# Patient Record
Sex: Female | Born: 1995 | Race: Black or African American | Hispanic: No | Marital: Married | Smoking: Former smoker
Health system: Southern US, Community
[De-identification: ages and names within clinical notes are randomized; demographics above are authoritative.]

## PROBLEM LIST (undated history)

## (undated) DIAGNOSIS — Z789 Other specified health status: Secondary | ICD-10-CM

## (undated) HISTORY — PX: NO PAST SURGERIES: SHX2092

## (undated) SURGERY — Surgical Case
Anesthesia: *Unknown

---

## 2020-02-09 ENCOUNTER — Inpatient Hospital Stay (HOSPITAL_COMMUNITY)
Admission: AD | Admit: 2020-02-09 | Discharge: 2020-02-09 | Disposition: A | Discharge status: Home / Self Care | Payer: Self-pay | Attending: Obstetrics & Gynecology | Admitting: Obstetrics & Gynecology

## 2020-02-09 ENCOUNTER — Other Ambulatory Visit: Payer: Self-pay

## 2020-02-09 DIAGNOSIS — Z3A01 Less than 8 weeks gestation of pregnancy: Secondary | ICD-10-CM | POA: Insufficient documentation

## 2020-02-09 DIAGNOSIS — Z3490 Encounter for supervision of normal pregnancy, unspecified, unspecified trimester: Secondary | ICD-10-CM

## 2020-02-09 DIAGNOSIS — O26891 Other specified pregnancy related conditions, first trimester: Secondary | ICD-10-CM | POA: Insufficient documentation

## 2020-02-09 NOTE — Discharge Instructions (Signed)
[place] [facility]yn Providers    [facility]       Phone: [phone]  [facility]   Phone: [phone]  [facility]   Phone: [phone]  [facility] [place]  Phone: [phone]  [facility] [place]  Phone: [phone]  [facility]   Phone: [phone]  [facility]       Phone: [phone]  [facility]    Phone: [phone]   [facility]    Phone: [phone]  [facility]    Phone: [phone]  [facility]    Phone: [phone]  [facility]       Phone: [phone]   [facility]  Phone: [phone]  [facility]    Phone: [phone]  [facility]   Phone: [phone]  [facility]      Phone: [phone]  [facility]    Phone: [phone]   

## 2020-02-09 NOTE — MAU Provider Note (Signed)
Patient Brittany Leon is a 24 y.o. No obstetric history on file.  at Unknown here to "check on the baby". She denies any complaints today.   She says that she had an LMP in December and then a positive pregnancy at the beginning of February.    Assessment and Plan  1.  1. Pregnancy, unspecified gestational age    28. Message sent to [facility] to schedule OB appointment in two weeks, after her dating [place].   3. Outpatient [place] for dating ordered.    Charlesetta Garibaldi Mahmoud Blazejewski 02/09/2020, 4:41 PM

## 2020-02-09 NOTE — MAU Note (Signed)
Brittany Leon is a 23 y.o. here in MAU reporting: is here to check on the baby. No pain, bleeding, or discharge. Has + UPT in February.   LMP: 10/25/19  Pain score: 0/10  Vitals:   02/09/20 1629  BP: (!) 97/51  Pulse: 75  Resp: 16  Temp: 99 F (37.2 C)  SpO2: 100%     Lab orders placed from triage: none

## 2020-02-13 ENCOUNTER — Ambulatory Visit (HOSPITAL_COMMUNITY): Payer: Self-pay

## 2020-02-19 ENCOUNTER — Other Ambulatory Visit: Payer: Self-pay

## 2020-02-19 ENCOUNTER — Other Ambulatory Visit (HOSPITAL_COMMUNITY): Payer: Self-pay | Admitting: Student

## 2020-02-19 ENCOUNTER — Ambulatory Visit (HOSPITAL_COMMUNITY)
Admission: RE | Admit: 2020-02-19 | Discharge: 2020-02-19 | Disposition: A | Discharge status: Home / Self Care | Payer: Medicaid Other | Source: Ambulatory Visit | Attending: Student | Admitting: Student

## 2020-02-19 ENCOUNTER — Ambulatory Visit (INDEPENDENT_AMBULATORY_CARE_PROVIDER_SITE_OTHER): Payer: Self-pay

## 2020-02-19 ENCOUNTER — Encounter: Payer: Self-pay | Admitting: Family Medicine

## 2020-02-19 DIAGNOSIS — Z3492 Encounter for supervision of normal pregnancy, unspecified, second trimester: Secondary | ICD-10-CM

## 2020-02-19 DIAGNOSIS — Z3490 Encounter for supervision of normal pregnancy, unspecified, unspecified trimester: Secondary | ICD-10-CM | POA: Insufficient documentation

## 2020-02-19 NOTE — Progress Notes (Signed)
Patient seen and assessed by nursing staff during this encounter. I have reviewed the chart and agree with the documentation and plan. I have also made any necessary editorial changes.  Cherre Robins, CNM 02/19/2020 3:27 PM

## 2020-02-19 NOTE — Progress Notes (Signed)
Pt here today for OB US results for unsure of LMP.  Reports presents with viable pregnancy with EDD 08/29/20, 12w 4d today, and FHR 154 bpm.  Notified Gerrit Heck, CNM who recommends to start prenatal care and PNV.  Pt denies any pain or vaginal bleeding. Medications/allergies reviewed.  List of medicines safe to take in pregnancy given.  Front office to provide pt with proof of pregnancy letter to start prenatal care.    Addison Naegeli, RN 02/19/20

## 2020-04-04 LAB — OB RESULTS CONSOLE ANTIBODY SCREEN: Antibody Screen: NEGATIVE

## 2020-04-04 LAB — OB RESULTS CONSOLE RPR: RPR: NONREACTIVE

## 2020-04-04 LAB — OB RESULTS CONSOLE HEPATITIS B SURFACE ANTIGEN
Hepatitis B Surface Ag: NEGATIVE
Hepatitis B Surface Ag: NEGATIVE

## 2020-04-04 LAB — OB RESULTS CONSOLE RUBELLA ANTIBODY, IGM
Rubella: IMMUNE
Rubella: IMMUNE

## 2020-04-04 LAB — OB RESULTS CONSOLE ABO/RH: RH Type: POSITIVE

## 2020-04-04 LAB — OB RESULTS CONSOLE HIV ANTIBODY (ROUTINE TESTING)
HIV: NONREACTIVE
HIV: NONREACTIVE

## 2020-04-22 ENCOUNTER — Encounter: Payer: Self-pay | Admitting: *Deleted

## 2020-04-24 ENCOUNTER — Other Ambulatory Visit: Payer: Self-pay | Admitting: Obstetrics and Gynecology

## 2020-04-24 ENCOUNTER — Other Ambulatory Visit: Payer: Self-pay

## 2020-04-24 ENCOUNTER — Ambulatory Visit: Payer: Medicaid Other | Attending: Obstetrics and Gynecology

## 2020-04-24 DIAGNOSIS — Z3A21 21 weeks gestation of pregnancy: Secondary | ICD-10-CM | POA: Diagnosis not present

## 2020-04-24 DIAGNOSIS — Z3490 Encounter for supervision of normal pregnancy, unspecified, unspecified trimester: Secondary | ICD-10-CM | POA: Diagnosis present

## 2020-04-24 DIAGNOSIS — Z363 Encounter for antenatal screening for malformations: Secondary | ICD-10-CM

## 2020-04-24 DIAGNOSIS — Z3689 Encounter for other specified antenatal screening: Secondary | ICD-10-CM

## 2020-08-06 IMAGING — US US OB COMP LESS 14 WK
1 series · 15 of 28 positions shown · non-contrast
Comparison: None

CLINICAL DATA: First trimester pregnancy, unspecified gestational
age, uncertain dating, unknown LMP

EXAM:
OBSTETRIC <14 WK ULTRASOUND
TECHNIQUE: Transabdominal ultrasound was performed for evaluation of the
gestation as well as the maternal uterus and adnexal regions.

[Series 1: us ob comp less 14 wk · 15 of 38 slices shown]
[im 1/38]
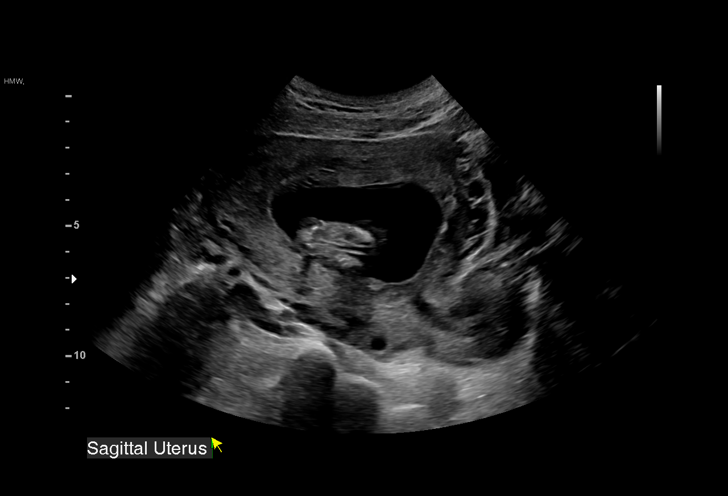
[im 3/38]
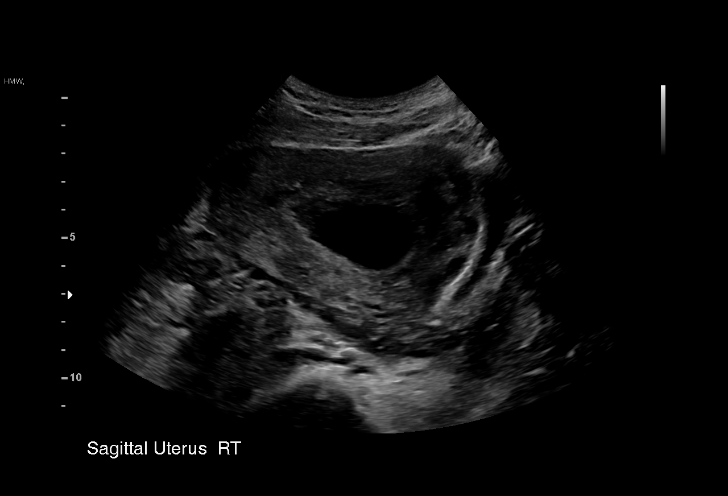
[im 6/38]
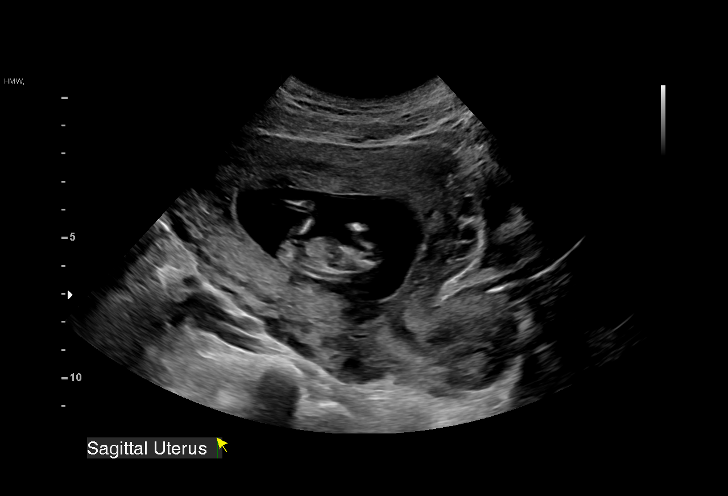
[im 9/38]
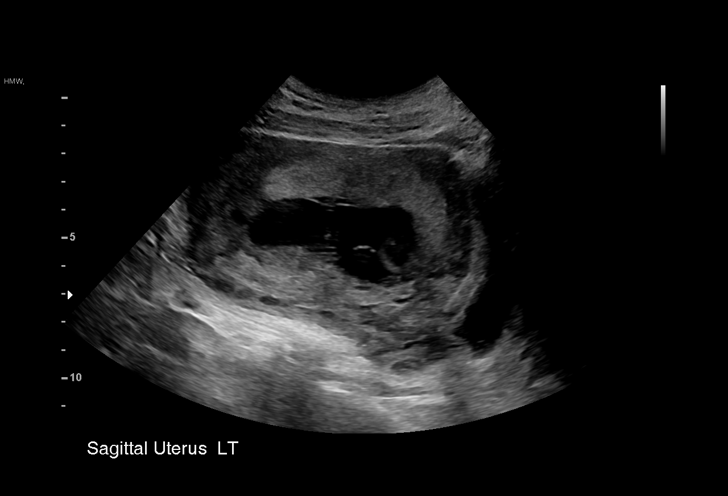
[im 11/38]
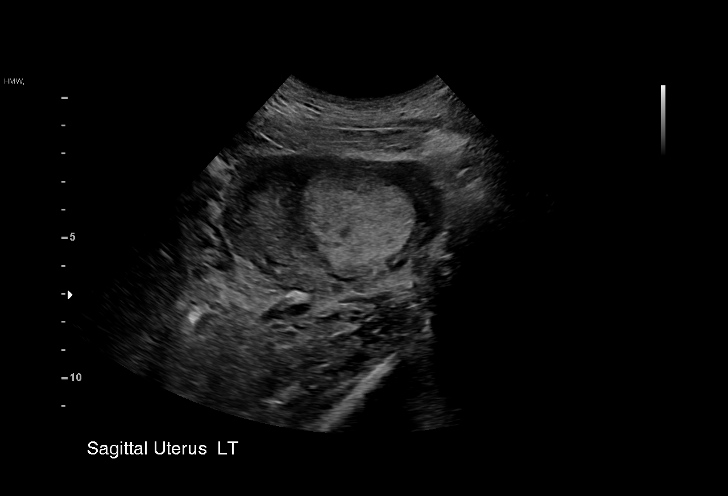
[im 14/38]
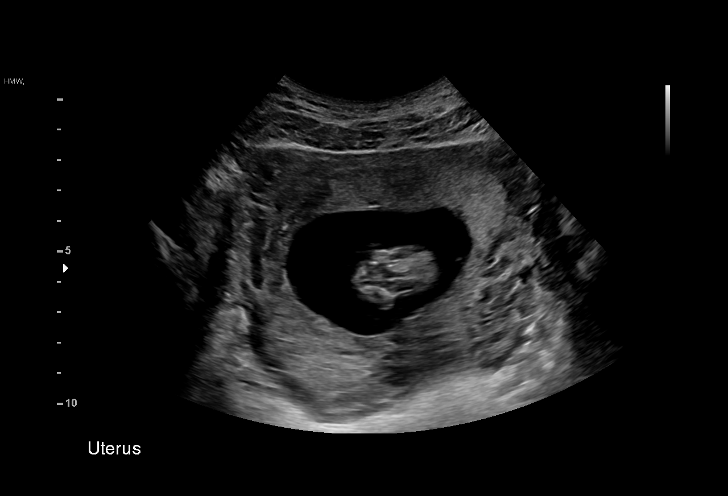
[im 17/38]
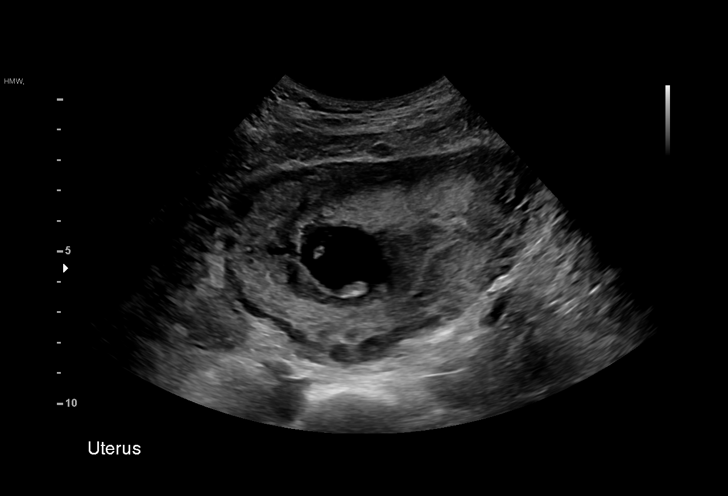
[im 20/38]
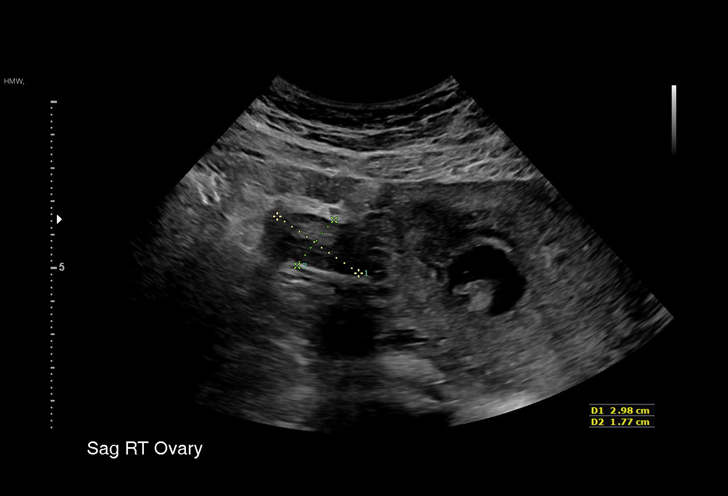
[im 21/38]
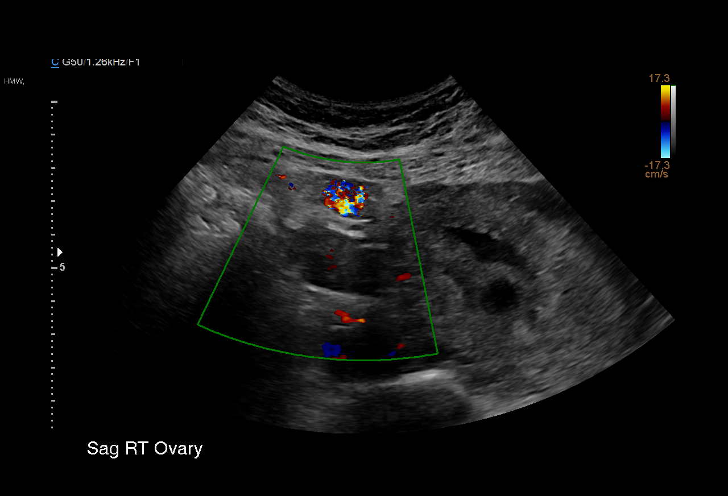
[im 24/38]
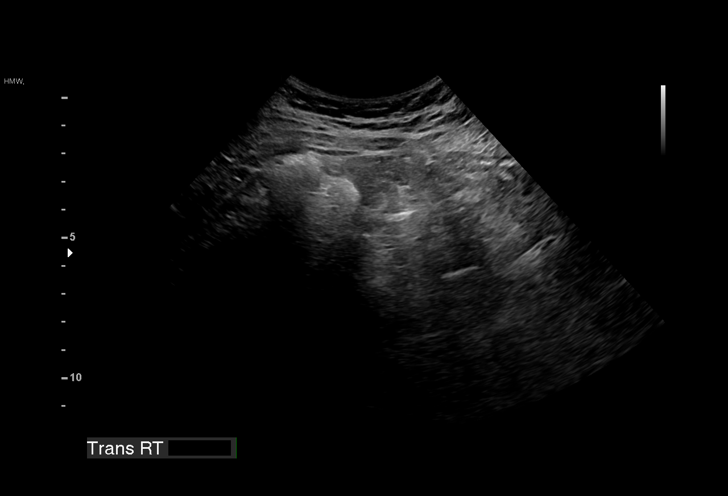
[im 27/38]
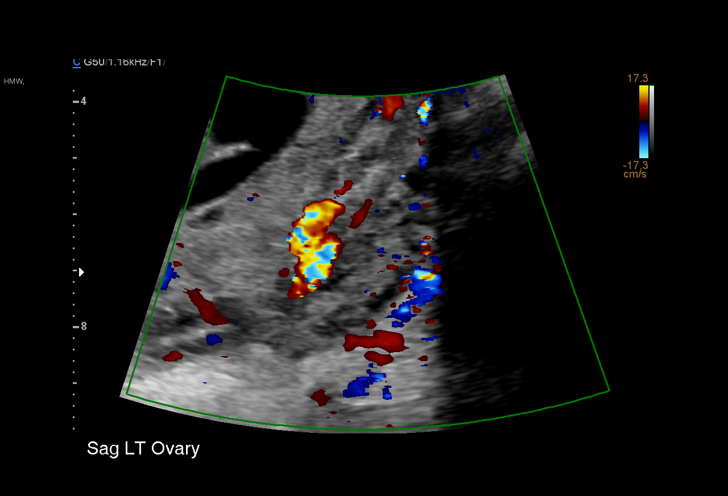
[im 29/38]
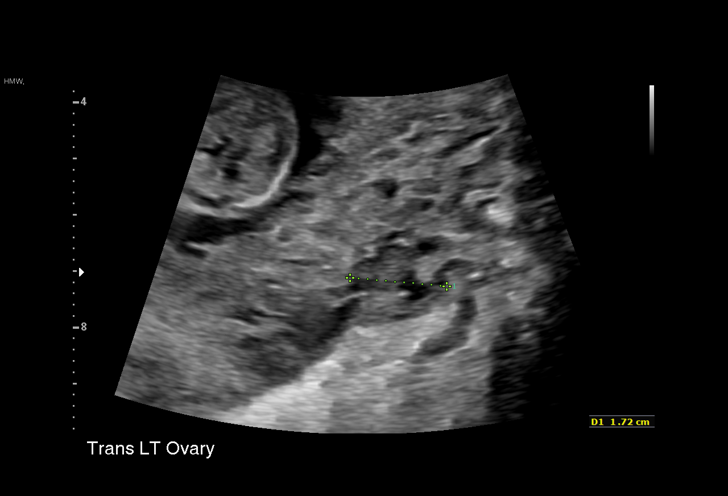
[im 32/38]
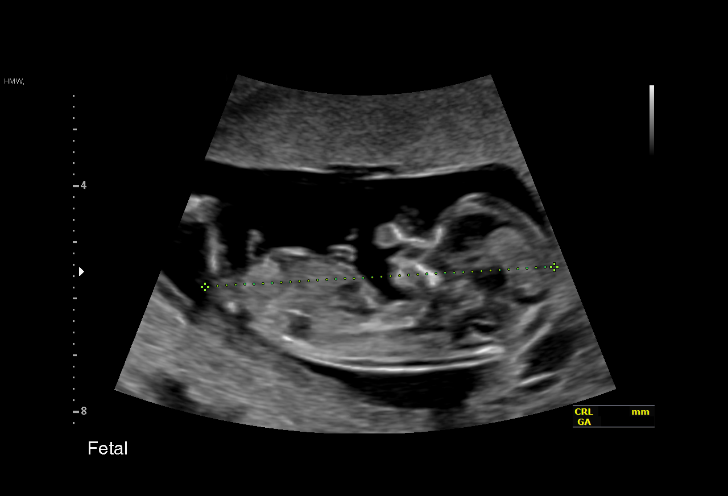
[im 35/38]
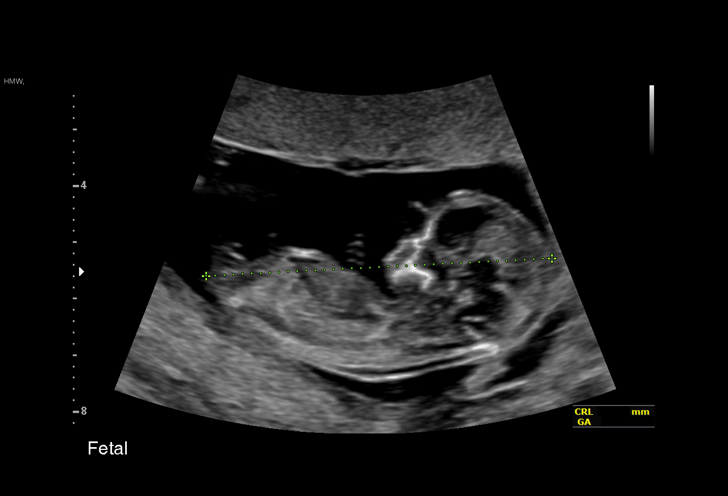
[im 38/38]
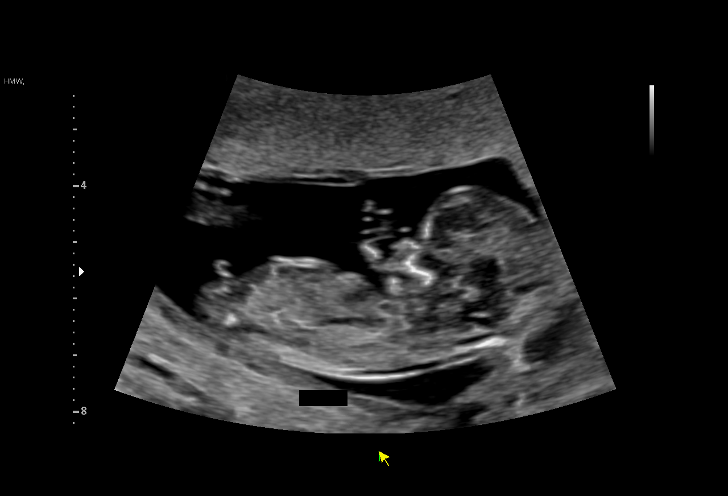

[15 of 28 positions shown; findings below may reference images not displayed]

FINDINGS: Intrauterine gestational sac: Present, single

Yolk sac:  Not visualized

Embryo:  Present

Cardiac Activity: Present

Heart Rate: 154 bpm

CRL:   61 mm   12 w 4 d                  US EDC: 08/29/2020

Subchorionic hemorrhage:  None visualized.

Maternal uterus/adnexae:

RIGHT ovary normal size and morphology 3.0 x 1.8 x 1.9 cm.

LEFT ovary normal size and morphology 3.3 x 1.5 x 1.7 cm.

No free pelvic fluid or adnexal masses.

Maternal uterus otherwise unremarkable.
IMPRESSION: Single live intrauterine gestation at 12 weeks 4 days EGA by
crown-rump length.

No acute abnormalities.

## 2020-08-12 ENCOUNTER — Other Ambulatory Visit: Payer: Self-pay | Admitting: Obstetrics & Gynecology

## 2020-08-12 LAB — OB RESULTS CONSOLE GBS: GBS: NEGATIVE

## 2020-08-13 ENCOUNTER — Telehealth (HOSPITAL_COMMUNITY): Payer: Self-pay | Admitting: *Deleted

## 2020-08-13 NOTE — Telephone Encounter (Signed)
Preadmission screen Instructed to go for covid test, NPO after midnight and arrive at 0800 tomorrow morning for Version.  Verbalized understanding

## 2020-08-13 NOTE — Telephone Encounter (Signed)
Unable to get covid test done.  Instructed to arrive at 0700 for covid test before version.

## 2020-08-14 ENCOUNTER — Ambulatory Visit (HOSPITAL_COMMUNITY)
Admission: AD | Admit: 2020-08-14 | Discharge: 2020-08-14 | Disposition: A | Discharge status: Home / Self Care | Payer: Medicaid Other | Attending: Obstetrics & Gynecology | Admitting: Obstetrics & Gynecology

## 2020-08-14 ENCOUNTER — Other Ambulatory Visit: Payer: Self-pay

## 2020-08-14 ENCOUNTER — Ambulatory Visit (HOSPITAL_COMMUNITY)
Admission: RE | Admit: 2020-08-14 | Discharge: 2020-08-14 | Disposition: A | Discharge status: Home / Self Care | Payer: Medicaid Other | Source: Ambulatory Visit | Attending: Obstetrics & Gynecology | Admitting: Obstetrics & Gynecology

## 2020-08-14 DIAGNOSIS — Z20822 Contact with and (suspected) exposure to covid-19: Secondary | ICD-10-CM | POA: Insufficient documentation

## 2020-08-14 DIAGNOSIS — Z3A37 37 weeks gestation of pregnancy: Secondary | ICD-10-CM | POA: Insufficient documentation

## 2020-08-14 DIAGNOSIS — O321XX0 Maternal care for breech presentation, not applicable or unspecified: Secondary | ICD-10-CM | POA: Insufficient documentation

## 2020-08-14 LAB — CBC
HCT: 35 % — ABNORMAL LOW (ref 36.0–46.0)
Hemoglobin: 10.9 g/dL — ABNORMAL LOW (ref 12.0–15.0)
MCH: 25.5 pg — ABNORMAL LOW (ref 26.0–34.0)
MCHC: 31.1 g/dL (ref 30.0–36.0)
MCV: 81.8 fL (ref 80.0–100.0)
Platelets: 323 10*3/uL (ref 150–400)
RBC: 4.28 MIL/uL (ref 3.87–5.11)
RDW: 13.5 % (ref 11.5–15.5)
WBC: 6.5 10*3/uL (ref 4.0–10.5)
nRBC: 0 % (ref 0.0–0.2)

## 2020-08-14 LAB — RESPIRATORY PANEL BY RT PCR (FLU A&B, COVID)
Influenza A by PCR: NEGATIVE
Influenza B by PCR: NEGATIVE
SARS Coronavirus 2 by RT PCR: NEGATIVE

## 2020-08-14 MED ORDER — TERBUTALINE SULFATE 1 MG/ML IJ SOLN
INTRAMUSCULAR | Status: AC
  Filled 2020-08-14: qty 1

## 2020-08-14 MED ORDER — TERBUTALINE SULFATE 1 MG/ML IJ SOLN: 0.2500 mg | Freq: Once | INTRAMUSCULAR | Status: DC

## 2020-08-14 NOTE — Discharge Summary (Signed)
Patient was briefly admitted for scheduled external cephalic version at 37+ weeks. Bedside US done and baby was deemed to be vertex, patient discharged home with labor precautions to f/u in office next week.   Naoma Diener Lindsi Bayliss

## 2020-08-14 NOTE — H&P (Signed)
Brittany Leon is a 24 y.o. female, G1P0000, IUP at 37.5 weeks, presenting for version with Dr Mora Appl for breech presentation, noted on [place] @37 .4 with anterior placenta. Pt endorse + Fm. Denies vaginal leakage. Denies vaginal bleeding. Denies feeling cxt's.   NOB Labs:      Blood Type O  04/04/2020      D (Rh) Type Positive  04/04/2020      Antibody Screen Negative  04/04/2020      HCT - Initial 36.2  04/04/2020      HGB - Initial 12.2  04/04/2020      MCV - Initial 89  04/04/2020      PLT - Initial 322  04/04/2020      VDRL - Initial Non-Reactive  04/04/2020      Urine Culture/Screen Negative  04/04/2020      HBsAg Negative  04/04/2020      HIV Counseling/Testing Non-Reactive  There are no problems to display for this patient.   No medications prior to admission.    No past medical history on file.   Current Facility-Administered Medications on File Prior to Encounter  Medication Dose Route Frequency Provider Last Rate Last Admin   terbutaline (BRETHINE) injection 0.25 mg  0.25 mg Subcutaneous Once 04/06/2020, [place]       No current outpatient medications on file prior to encounter.     No Known Allergies   OB History    Gravida  1   Para      Term      Preterm      AB      Living        SAB      TAB      Ectopic      Multiple      Live Births             Family History: family history is not on file. Social History:  has no history on file for tobacco use, alcohol use, and drug use.   Prenatal Transfer Tool  Maternal Diabetes: No Genetic Screening: Normal Maternal Ultrasounds/Referrals: Normal Fetal Ultrasounds or other Referrals:  None Maternal Substance Abuse:  No Significant Maternal Medications:  None Significant Maternal Lab Results: Other: GBS Pending  ROS:  Review of Systems  Constitutional: Negative.   HENT: Negative.   Eyes: Negative.   Respiratory: Negative.   Cardiovascular: Negative.   Gastrointestinal: Negative.    Genitourinary: Negative.   Musculoskeletal: Negative.   Skin: Negative.   Neurological: Negative.   Endo/Heme/Allergies: Negative.   Psychiatric/Behavioral: Negative.      Physical Exam: LMP 11/24/2019  BP 110/68, HR 86, Temp 97.8, RR 18 Physical Exam Vitals and nursing note reviewed. Exam conducted with a chaperone present.  HENT:     Head: Normocephalic.     Nose: Nose normal.     Mouth/Throat:     Mouth: Mucous membranes are moist.  Eyes:     Pupils: Pupils are equal, round, and reactive to light.  Cardiovascular:     Rate and Rhythm: Normal rate and regular rhythm.     Pulses: Normal pulses.     Heart sounds: Normal heart sounds.  Pulmonary:     Effort: Pulmonary effort is normal.     Breath sounds: Normal breath sounds.  Abdominal:     General: Bowel sounds are normal.     Palpations: Abdomen is soft.  Genitourinary:    Comments: Check in office , deferred today Musculoskeletal:  General: Normal range of motion.     Cervical back: Normal range of motion and neck supple.  Skin:    General: Skin is warm.     Capillary Refill: Capillary refill takes less than 2 seconds.  Neurological:     General: No focal deficit present.     Mental Status: She is alert.  Psychiatric:        Mood and Affect: Mood normal.      NST: FHR baseline 150 bpm, Variability: moderate, Accelerations:present, Decelerations:  Absent= Cat 1/Reactive UC:   none SVE:  0/thick/high/breech  Leopold's: Position Breech  Labs: No results found for this or any previous visit (from the past 24 hour(s)).  Imaging:  No results found.  MAU Course: No orders of the defined types were placed in this encounter.  No orders of the defined types were placed in this encounter.   Assessment/Plan: Brittany Leon is a 24 y.o. female, G1P0000, IUP at 37.5 weeks, presenting for version.   FWB: Cat 1 Fetal Tracing.   Plan: Admit to pre-op per consult with DR Mora Appl Routine [facility] orders for  version PIV CBC Covid testing Terbutaline Anticipate successful version   [facility] NP-C, CNM, MSN 08/14/2020, 7:43 AM

## 2020-08-14 NOTE — OR Nursing (Signed)
Erven Colla rn performed bedside US on pt .  Pt vertex

## 2020-08-20 ENCOUNTER — Inpatient Hospital Stay (HOSPITAL_COMMUNITY)
Admission: AD | Admit: 2020-08-20 | Discharge: 2020-08-24 | DRG: 787 | Disposition: A | Discharge status: Home / Self Care | Payer: Medicaid Other | Attending: Obstetrics & Gynecology | Admitting: Obstetrics & Gynecology

## 2020-08-20 ENCOUNTER — Other Ambulatory Visit: Payer: Self-pay

## 2020-08-20 ENCOUNTER — Encounter (HOSPITAL_COMMUNITY): Payer: Self-pay | Admitting: Obstetrics and Gynecology

## 2020-08-20 DIAGNOSIS — Z3A38 38 weeks gestation of pregnancy: Secondary | ICD-10-CM | POA: Diagnosis not present

## 2020-08-20 DIAGNOSIS — O328XX0 Maternal care for other malpresentation of fetus, not applicable or unspecified: Secondary | ICD-10-CM

## 2020-08-20 DIAGNOSIS — O321XX0 Maternal care for breech presentation, not applicable or unspecified: Secondary | ICD-10-CM | POA: Diagnosis present

## 2020-08-20 DIAGNOSIS — O322XX0 Maternal care for transverse and oblique lie, not applicable or unspecified: Secondary | ICD-10-CM | POA: Diagnosis present

## 2020-08-20 DIAGNOSIS — Z98891 History of uterine scar from previous surgery: Secondary | ICD-10-CM

## 2020-08-20 DIAGNOSIS — O4292 Full-term premature rupture of membranes, unspecified as to length of time between rupture and onset of labor: Principal | ICD-10-CM | POA: Diagnosis present

## 2020-08-20 DIAGNOSIS — O9081 Anemia of the puerperium: Secondary | ICD-10-CM | POA: Diagnosis not present

## 2020-08-20 DIAGNOSIS — D62 Acute posthemorrhagic anemia: Secondary | ICD-10-CM

## 2020-08-20 DIAGNOSIS — O26893 Other specified pregnancy related conditions, third trimester: Secondary | ICD-10-CM | POA: Diagnosis present

## 2020-08-20 HISTORY — DX: Other specified health status: Z78.9

## 2020-08-20 LAB — CBC
HCT: 34.3 % — ABNORMAL LOW (ref 36.0–46.0)
Hemoglobin: 11 g/dL — ABNORMAL LOW (ref 12.0–15.0)
MCH: 25.9 pg — ABNORMAL LOW (ref 26.0–34.0)
MCHC: 32.1 g/dL (ref 30.0–36.0)
MCV: 80.7 fL (ref 80.0–100.0)
Platelets: 352 10*3/uL (ref 150–400)
RBC: 4.25 MIL/uL (ref 3.87–5.11)
RDW: 13.8 % (ref 11.5–15.5)
WBC: 9.5 10*3/uL (ref 4.0–10.5)
nRBC: 0 % (ref 0.0–0.2)

## 2020-08-20 LAB — TYPE AND SCREEN
ABO/RH(D): O POS
Antibody Screen: NEGATIVE

## 2020-08-20 LAB — POCT FERN TEST: POCT Fern Test: POSITIVE

## 2020-08-20 MED ORDER — ACETAMINOPHEN 325 MG PO TABS: 650.0000 mg | ORAL_TABLET | ORAL | Status: DC | PRN

## 2020-08-20 MED ORDER — EPHEDRINE 5 MG/ML INJ: 10.0000 mg | INTRAVENOUS | Status: DC | PRN

## 2020-08-20 MED ORDER — PHENYLEPHRINE 40 MCG/ML (10ML) SYRINGE FOR IV PUSH (FOR BLOOD PRESSURE SUPPORT)
80.0000 ug | PREFILLED_SYRINGE | INTRAVENOUS | Status: DC | PRN
  Filled 2020-08-20: qty 10

## 2020-08-20 MED ORDER — FENTANYL-BUPIVACAINE-NACL 0.5-0.125-0.9 MG/250ML-% EP SOLN
12.0000 mL/h | EPIDURAL | Status: DC | PRN
  Filled 2020-08-20: qty 250

## 2020-08-20 MED ORDER — LACTATED RINGERS IV SOLN: INTRAVENOUS | Status: DC

## 2020-08-20 MED ORDER — OXYTOCIN-SODIUM CHLORIDE 30-0.9 UT/500ML-% IV SOLN
1.0000 m[IU]/min | INTRAVENOUS | Status: DC
  Administered 2020-08-20: 2 m[IU]/min via INTRAVENOUS
  Filled 2020-08-20: qty 500

## 2020-08-20 MED ORDER — LIDOCAINE HCL (PF) 1 % IJ SOLN: 30.0000 mL | INTRAMUSCULAR | Status: DC | PRN

## 2020-08-20 MED ORDER — OXYCODONE-ACETAMINOPHEN 5-325 MG PO TABS: 1.0000 | ORAL_TABLET | ORAL | Status: DC | PRN

## 2020-08-20 MED ORDER — OXYCODONE-ACETAMINOPHEN 5-325 MG PO TABS: 2.0000 | ORAL_TABLET | ORAL | Status: DC | PRN

## 2020-08-20 MED ORDER — LACTATED RINGERS IV SOLN: 500.0000 mL | INTRAVENOUS | Status: DC | PRN

## 2020-08-20 MED ORDER — ONDANSETRON HCL 4 MG/2ML IJ SOLN: 4.0000 mg | Freq: Four times a day (QID) | INTRAMUSCULAR | Status: DC | PRN

## 2020-08-20 MED ORDER — FLEET ENEMA 7-19 GM/118ML RE ENEM: 1.0000 | ENEMA | RECTAL | Status: DC | PRN

## 2020-08-20 MED ORDER — OXYTOCIN-SODIUM CHLORIDE 30-0.9 UT/500ML-% IV SOLN: 2.5000 [IU]/h | INTRAVENOUS | Status: DC

## 2020-08-20 MED ORDER — TERBUTALINE SULFATE 1 MG/ML IJ SOLN
0.2500 mg | Freq: Once | INTRAMUSCULAR | Status: DC | PRN
  Filled 2020-08-20: qty 1

## 2020-08-20 MED ORDER — DIPHENHYDRAMINE HCL 50 MG/ML IJ SOLN: 12.5000 mg | INTRAMUSCULAR | Status: DC | PRN

## 2020-08-20 MED ORDER — LACTATED RINGERS IV SOLN
500.0000 mL | Freq: Once | INTRAVENOUS | Status: AC
  Administered 2020-08-21: 500 mL via INTRAVENOUS

## 2020-08-20 MED ORDER — SOD CITRATE-CITRIC ACID 500-334 MG/5ML PO SOLN
30.0000 mL | ORAL | Status: DC | PRN
  Administered 2020-08-21: 30 mL via ORAL
  Filled 2020-08-20: qty 15

## 2020-08-20 MED ORDER — OXYTOCIN BOLUS FROM INFUSION: 333.0000 mL | Freq: Once | INTRAVENOUS | Status: DC

## 2020-08-20 MED ORDER — PHENYLEPHRINE 40 MCG/ML (10ML) SYRINGE FOR IV PUSH (FOR BLOOD PRESSURE SUPPORT): 80.0000 ug | PREFILLED_SYRINGE | INTRAVENOUS | Status: DC | PRN

## 2020-08-20 NOTE — Progress Notes (Signed)
Fern positive per RN blind swab, CNM SSE not indicated. Vertex presentation confirmed with BSUS.  Clayton Bibles, MSN, CNM Certified Nurse Midwife, [facility] for [employer], [facility] 08/20/20 6:39 PM

## 2020-08-20 NOTE — MAU Note (Signed)
Marylene Land RN [facility] called Infection Prevention regarding necessity to reswab pt for Covid testing. Pt tested negative 6 days ago before coming in for version and pt vtx when arrived for version at that time. Being admitted tonight for SROM.  No covid symptoms currently.

## 2020-08-20 NOTE — H&P (Signed)
OB ADMISSION/ HISTORY & PHYSICAL:  Admission Date: 08/20/2020  5:46 PM  Admit Diagnosis: SROM  Brittany Leon is a 24 y.o. female G1P0 [redacted]w[redacted]d presenting for leaking at 73. Endorses active FM, vaginal bleeding. Ctx began @ 1500 and increased with LOF.   History of current pregnancy: G1P0   Patient entered care with [facility] at 18+5 wks.   EDC 08/30/20 by [place] @ 12+4 wk   Anatomy scan:  21 wks, complete w/ anterior placenta.   Antenatal testing:N/A Last evaluation: 38 wks for breech presentation, pt sched for external version. Vertex presentation noted, version not required.   Significant prenatal events:  Late entry to care @ 18 wks   Prenatal Labs: ABO, Rh: O/Positive/-- (05/21 0000) Antibody: Negative (05/21 0000) Rubella: Immune (05/21 0000)  RPR: Nonreactive (05/21 0000)  HBsAg: Negative (05/21 0000)  HIV: Non-reactive (05/21 0000)  GTT: passed 1 hr GBS:   neg GC/CHL: neg/neg Genetics: low-risk female, AFP neg Tdap/influenza vaccines: tdap intrapartum, declines flu   OB History  Gravida Para Term Preterm AB Living  1            SAB TAB Ectopic Multiple Live Births               # Outcome Date GA Lbr Len/2nd Weight Sex Delivery Anes PTL Lv  1 Current             Medical / Surgical History: Past medical history:  Past Medical History:  Diagnosis Date  . Medical history non-contributory     Past surgical history:  Past Surgical History:  Procedure Laterality Date  . NO PAST SURGERIES     Family History: History reviewed. No pertinent family history.  Social History:  reports that she has never smoked. She has never used smokeless tobacco. She reports that she does not drink alcohol and does not use drugs.  Allergies: Patient has no known allergies.   Current Medications at time of admission:  Prior to Admission medications   Medication Sig Start Date End Date Taking? Authorizing Provider  Prenatal Vit-Fe Fumarate-FA (MULTIVITAMIN-PRENATAL) 27-0.8 MG TABS tablet  Take 1 tablet by mouth daily at 12 noon.   Yes [provider]    Review of Systems: Constitutional: Negative   HENT: Negative   Eyes: Negative   Respiratory: Negative   Cardiovascular: Negative   Gastrointestinal: Negative  Genitourinary: positive for bloody show, positive for LOF   Musculoskeletal: Negative   Skin: Negative   Neurological: Negative   Endo/Heme/Allergies: Negative   Psychiatric/Behavioral: Negative    Physical Exam: VS: Blood pressure 119/64, pulse 90, temperature 98.5 F (36.9 C), resp. rate 20, last menstrual period 11/24/2019, SpO2 100 %. AAO x3, no signs of distress Cardiovascular: RRR Respiratory: Lung fields clear to ausculation GU/GI: Abdomen gravid, non-tender, non-distended, active FM, vertex Extremities: no edema, negative for pain, tenderness, and cords  Cervical exam:Dilation: 1 Effacement (%): Thick Exam by:: F. Morris, RNC FHR: baseline rate 145 / variability moderate / accelerations present / absent decelerations TOCO: 4-6 m in   Prenatal Transfer Tool  Maternal Diabetes: No Genetic Screening: Normal Maternal Ultrasounds/Referrals: Normal Fetal Ultrasounds or other Referrals:  None Maternal Substance Abuse:  No Significant Maternal Medications:  None Significant Maternal Lab Results: Group B Strep negative    Assessment: 24 y.o. G1P0 [redacted]w[redacted]d SROM @ term  Latent stage of labor FHR category 1 GBS neg Pain management plan: epidural   Plan:  Admit to L&D Routine admission orders Pitocin induction Epidural PRN  Dr Normand Sloop notified of admission and plan of care  Roma Schanz MSN, CNM 08/20/2020 6:52 PM

## 2020-08-21 ENCOUNTER — Inpatient Hospital Stay (HOSPITAL_COMMUNITY): Payer: Medicaid Other | Admitting: Anesthesiology

## 2020-08-21 ENCOUNTER — Encounter (HOSPITAL_COMMUNITY)
Admission: AD | Disposition: A | Discharge status: Home / Self Care | Payer: Self-pay | Source: Home / Self Care | Attending: Obstetrics & Gynecology

## 2020-08-21 DIAGNOSIS — Z98891 History of uterine scar from previous surgery: Secondary | ICD-10-CM

## 2020-08-21 LAB — RPR: RPR Ser Ql: NONREACTIVE

## 2020-08-21 SURGERY — Surgical Case
Anesthesia: Epidural

## 2020-08-21 MED ORDER — CEFAZOLIN SODIUM-DEXTROSE 2-4 GM/100ML-% IV SOLN
INTRAVENOUS | Status: AC
  Filled 2020-08-21: qty 100

## 2020-08-21 MED ORDER — LACTATED RINGERS IV SOLN: INTRAVENOUS | Status: DC

## 2020-08-21 MED ORDER — LACTATED RINGERS AMNIOINFUSION: INTRAVENOUS | Status: DC

## 2020-08-21 MED ORDER — SIMETHICONE 80 MG PO CHEW
80.0000 mg | CHEWABLE_TABLET | ORAL | Status: DC
  Administered 2020-08-22 – 2020-08-23 (×3): 80 mg via ORAL
  Filled 2020-08-21 (×3): qty 1

## 2020-08-21 MED ORDER — ACETAMINOPHEN 500 MG PO TABS
1000.0000 mg | ORAL_TABLET | Freq: Four times a day (QID) | ORAL | Status: DC
  Administered 2020-08-22 – 2020-08-24 (×9): 1000 mg via ORAL
  Filled 2020-08-21 (×7): qty 2

## 2020-08-21 MED ORDER — KETOROLAC TROMETHAMINE 30 MG/ML IJ SOLN
30.0000 mg | Freq: Four times a day (QID) | INTRAMUSCULAR | Status: AC | PRN
  Administered 2020-08-22: 30 mg via INTRAVENOUS
  Filled 2020-08-21: qty 1

## 2020-08-21 MED ORDER — DEXAMETHASONE SODIUM PHOSPHATE 10 MG/ML IJ SOLN
INTRAMUSCULAR | Status: AC
  Filled 2020-08-21: qty 1

## 2020-08-21 MED ORDER — DIBUCAINE (PERIANAL) 1 % EX OINT: 1.0000 "application " | TOPICAL_OINTMENT | CUTANEOUS | Status: DC | PRN

## 2020-08-21 MED ORDER — MIDAZOLAM HCL 2 MG/2ML IJ SOLN
INTRAMUSCULAR | Status: DC | PRN
  Administered 2020-08-21: 2 mg via INTRAVENOUS

## 2020-08-21 MED ORDER — PRENATAL MULTIVITAMIN CH
1.0000 | ORAL_TABLET | Freq: Every day | ORAL | Status: DC
  Administered 2020-08-22 – 2020-08-23 (×2): 1 via ORAL
  Filled 2020-08-21 (×2): qty 1

## 2020-08-21 MED ORDER — MIDAZOLAM HCL 2 MG/2ML IJ SOLN
INTRAMUSCULAR | Status: AC
  Filled 2020-08-21: qty 2

## 2020-08-21 MED ORDER — SODIUM CHLORIDE 0.9 % IR SOLN
Status: DC | PRN
  Administered 2020-08-21 (×2): 1

## 2020-08-21 MED ORDER — FENTANYL CITRATE (PF) 100 MCG/2ML IJ SOLN
INTRAMUSCULAR | Status: AC
Start: 2020-08-21 — End: 2020-08-21
  Filled 2020-08-21: qty 2

## 2020-08-21 MED ORDER — NALBUPHINE HCL 10 MG/ML IJ SOLN: 5.0000 mg | INTRAMUSCULAR | Status: DC | PRN

## 2020-08-21 MED ORDER — SIMETHICONE 80 MG PO CHEW: 80.0000 mg | CHEWABLE_TABLET | ORAL | Status: DC | PRN

## 2020-08-21 MED ORDER — ACETAMINOPHEN 500 MG PO TABS
1000.0000 mg | ORAL_TABLET | Freq: Four times a day (QID) | ORAL | Status: AC
  Filled 2020-08-21 (×2): qty 2

## 2020-08-21 MED ORDER — SODIUM BICARBONATE 8.4 % IV SOLN
INTRAVENOUS | Status: DC | PRN
  Administered 2020-08-21: 10 mL via EPIDURAL

## 2020-08-21 MED ORDER — MEPERIDINE HCL 25 MG/ML IJ SOLN
INTRAMUSCULAR | Status: DC | PRN
  Administered 2020-08-21 (×2): 12.5 mg via INTRAVENOUS

## 2020-08-21 MED ORDER — AMISULPRIDE (ANTIEMETIC) 5 MG/2ML IV SOLN
10.0000 mg | INTRAVENOUS | Status: DC
  Filled 2020-08-21: qty 4

## 2020-08-21 MED ORDER — MENTHOL 3 MG MT LOZG: 1.0000 | LOZENGE | OROMUCOSAL | Status: DC | PRN

## 2020-08-21 MED ORDER — ONDANSETRON HCL 4 MG/2ML IJ SOLN
INTRAMUSCULAR | Status: AC
  Filled 2020-08-21: qty 2

## 2020-08-21 MED ORDER — FENTANYL CITRATE (PF) 100 MCG/2ML IJ SOLN
50.0000 ug | INTRAMUSCULAR | Status: DC | PRN
  Administered 2020-08-21: 50 ug via INTRAVENOUS

## 2020-08-21 MED ORDER — SENNOSIDES-DOCUSATE SODIUM 8.6-50 MG PO TABS
2.0000 | ORAL_TABLET | ORAL | Status: DC
  Administered 2020-08-22 – 2020-08-23 (×3): 2 via ORAL
  Filled 2020-08-21 (×3): qty 2

## 2020-08-21 MED ORDER — LIDOCAINE-EPINEPHRINE (PF) 2 %-1:200000 IJ SOLN
INTRAMUSCULAR | Status: DC | PRN
  Administered 2020-08-21: 3 mL via EPIDURAL
  Administered 2020-08-21: 2 mL via EPIDURAL

## 2020-08-21 MED ORDER — DIPHENHYDRAMINE HCL 50 MG/ML IJ SOLN: 12.5000 mg | INTRAMUSCULAR | Status: DC | PRN

## 2020-08-21 MED ORDER — IBUPROFEN 800 MG PO TABS
800.0000 mg | ORAL_TABLET | Freq: Three times a day (TID) | ORAL | Status: DC
  Administered 2020-08-23 – 2020-08-24 (×5): 800 mg via ORAL
  Filled 2020-08-21 (×5): qty 1

## 2020-08-21 MED ORDER — SCOPOLAMINE 1 MG/3DAYS TD PT72: 1.0000 | MEDICATED_PATCH | Freq: Once | TRANSDERMAL | Status: DC

## 2020-08-21 MED ORDER — PHENYLEPHRINE 40 MCG/ML (10ML) SYRINGE FOR IV PUSH (FOR BLOOD PRESSURE SUPPORT)
PREFILLED_SYRINGE | INTRAVENOUS | Status: AC
  Filled 2020-08-21: qty 10

## 2020-08-21 MED ORDER — NALOXONE HCL 4 MG/10ML IJ SOLN
1.0000 ug/kg/h | INTRAVENOUS | Status: DC | PRN
  Filled 2020-08-21: qty 5

## 2020-08-21 MED ORDER — MORPHINE SULFATE (PF) 0.5 MG/ML IJ SOLN
INTRAMUSCULAR | Status: DC | PRN
Start: 2020-08-21 — End: 2020-08-21
  Administered 2020-08-21: 3 mg via EPIDURAL

## 2020-08-21 MED ORDER — FENTANYL CITRATE (PF) 100 MCG/2ML IJ SOLN: 25.0000 ug | INTRAMUSCULAR | Status: DC | PRN

## 2020-08-21 MED ORDER — MORPHINE SULFATE (PF) 0.5 MG/ML IJ SOLN
INTRAMUSCULAR | Status: AC
  Filled 2020-08-21: qty 10

## 2020-08-21 MED ORDER — NALBUPHINE HCL 10 MG/ML IJ SOLN: 5.0000 mg | Freq: Once | INTRAMUSCULAR | Status: DC | PRN

## 2020-08-21 MED ORDER — BUPIVACAINE HCL (PF) 0.5 % IJ SOLN
INTRAMUSCULAR | Status: AC
  Filled 2020-08-21: qty 90

## 2020-08-21 MED ORDER — BUPIVACAINE HCL (PF) 0.75 % IJ SOLN
INTRAMUSCULAR | Status: DC | PRN
Start: 2020-08-21 — End: 2020-08-21
  Administered 2020-08-21: 12 mL/h via EPIDURAL

## 2020-08-21 MED ORDER — METHYLERGONOVINE MALEATE 0.2 MG/ML IJ SOLN: 0.2000 mg | INTRAMUSCULAR | Status: DC | PRN

## 2020-08-21 MED ORDER — OXYTOCIN-SODIUM CHLORIDE 30-0.9 UT/500ML-% IV SOLN: 2.5000 [IU]/h | INTRAVENOUS | Status: AC

## 2020-08-21 MED ORDER — FENTANYL CITRATE (PF) 100 MCG/2ML IJ SOLN
INTRAMUSCULAR | Status: AC
  Filled 2020-08-21: qty 2

## 2020-08-21 MED ORDER — DIPHENHYDRAMINE HCL 25 MG PO CAPS: 25.0000 mg | ORAL_CAPSULE | Freq: Four times a day (QID) | ORAL | Status: DC | PRN

## 2020-08-21 MED ORDER — ONDANSETRON HCL 4 MG/2ML IJ SOLN
INTRAMUSCULAR | Status: DC | PRN
  Administered 2020-08-21: 4 mg via INTRAVENOUS

## 2020-08-21 MED ORDER — MEPERIDINE HCL 25 MG/ML IJ SOLN
INTRAMUSCULAR | Status: AC
Start: 2020-08-21 — End: ?
  Filled 2020-08-21: qty 1

## 2020-08-21 MED ORDER — ONDANSETRON HCL 4 MG/2ML IJ SOLN: 4.0000 mg | Freq: Three times a day (TID) | INTRAMUSCULAR | Status: DC | PRN

## 2020-08-21 MED ORDER — OXYCODONE HCL 5 MG PO TABS
5.0000 mg | ORAL_TABLET | ORAL | Status: DC | PRN
  Administered 2020-08-23: 10 mg via ORAL
  Filled 2020-08-21: qty 2

## 2020-08-21 MED ORDER — NALOXONE HCL 0.4 MG/ML IJ SOLN: 0.4000 mg | INTRAMUSCULAR | Status: DC | PRN

## 2020-08-21 MED ORDER — OXYTOCIN-SODIUM CHLORIDE 30-0.9 UT/500ML-% IV SOLN
INTRAVENOUS | Status: DC | PRN
  Administered 2020-08-21: 300 mL via INTRAVENOUS

## 2020-08-21 MED ORDER — ACETAMINOPHEN 10 MG/ML IV SOLN: 1000.0000 mg | Freq: Once | INTRAVENOUS | Status: DC | PRN

## 2020-08-21 MED ORDER — TETANUS-DIPHTH-ACELL PERTUSSIS 5-2.5-18.5 LF-MCG/0.5 IM SUSP: 0.5000 mL | Freq: Once | INTRAMUSCULAR | Status: DC

## 2020-08-21 MED ORDER — METHYLERGONOVINE MALEATE 0.2 MG PO TABS: 0.2000 mg | ORAL_TABLET | ORAL | Status: DC | PRN

## 2020-08-21 MED ORDER — KETOROLAC TROMETHAMINE 30 MG/ML IJ SOLN: 30.0000 mg | Freq: Four times a day (QID) | INTRAMUSCULAR | Status: AC | PRN

## 2020-08-21 MED ORDER — FENTANYL CITRATE (PF) 100 MCG/2ML IJ SOLN
25.0000 ug | INTRAMUSCULAR | Status: DC | PRN
  Administered 2020-08-21: 50 ug via INTRAVENOUS

## 2020-08-21 MED ORDER — OXYTOCIN-SODIUM CHLORIDE 30-0.9 UT/500ML-% IV SOLN
INTRAVENOUS | Status: AC
  Filled 2020-08-21: qty 500

## 2020-08-21 MED ORDER — SIMETHICONE 80 MG PO CHEW
80.0000 mg | CHEWABLE_TABLET | Freq: Three times a day (TID) | ORAL | Status: DC
  Administered 2020-08-22 – 2020-08-24 (×7): 80 mg via ORAL
  Filled 2020-08-21 (×6): qty 1

## 2020-08-21 MED ORDER — CEFAZOLIN SODIUM-DEXTROSE 2-3 GM-%(50ML) IV SOLR
INTRAVENOUS | Status: DC | PRN
  Administered 2020-08-21: 2 g via INTRAVENOUS

## 2020-08-21 MED ORDER — DIPHENHYDRAMINE HCL 25 MG PO CAPS: 25.0000 mg | ORAL_CAPSULE | ORAL | Status: DC | PRN

## 2020-08-21 MED ORDER — WITCH HAZEL-GLYCERIN EX PADS: 1.0000 "application " | MEDICATED_PAD | CUTANEOUS | Status: DC | PRN

## 2020-08-21 MED ORDER — COCONUT OIL OIL: 1.0000 "application " | TOPICAL_OIL | Status: DC | PRN

## 2020-08-21 MED ORDER — SODIUM CHLORIDE 0.9% FLUSH: 3.0000 mL | INTRAVENOUS | Status: DC | PRN

## 2020-08-21 MED ORDER — KETOROLAC TROMETHAMINE 30 MG/ML IJ SOLN: 30.0000 mg | Freq: Once | INTRAMUSCULAR | Status: DC | PRN

## 2020-08-21 MED ORDER — DEXAMETHASONE SODIUM PHOSPHATE 10 MG/ML IJ SOLN
INTRAMUSCULAR | Status: DC | PRN
  Administered 2020-08-21: 10 mg via INTRAVENOUS

## 2020-08-21 SURGICAL SUPPLY — 37 items
BARRIER ADHS 3X4 INTERCEED (GAUZE/BANDAGES/DRESSINGS) ×3 IMPLANT
BENZOIN TINCTURE PRP APPL 2/3 (GAUZE/BANDAGES/DRESSINGS) ×3 IMPLANT
CHLORAPREP W/TINT 26ML (MISCELLANEOUS) ×3 IMPLANT
CLAMP CORD UMBIL (MISCELLANEOUS) IMPLANT
CLOSURE STERI STRIP 1/2 X4 (GAUZE/BANDAGES/DRESSINGS) ×3 IMPLANT
CLOTH BEACON ORANGE TIMEOUT ST (SAFETY) ×3 IMPLANT
DRSG OPSITE POSTOP 4X10 (GAUZE/BANDAGES/DRESSINGS) ×3 IMPLANT
ELECT REM PT RETURN 9FT ADLT (ELECTROSURGICAL) ×3
ELECTRODE REM PT RTRN 9FT ADLT (ELECTROSURGICAL) ×1 IMPLANT
EXTRACTOR VACUUM KIWI (MISCELLANEOUS) IMPLANT
GLOVE BIO SURGEON STRL SZ 6.5 (GLOVE) ×2 IMPLANT
GLOVE BIO SURGEONS STRL SZ 6.5 (GLOVE) ×1
GLOVE BIOGEL PI IND STRL 7.0 (GLOVE) ×2 IMPLANT
GLOVE BIOGEL PI INDICATOR 7.0 (GLOVE) ×4
GOWN STRL REUS W/TWL LRG LVL3 (GOWN DISPOSABLE) ×9 IMPLANT
KIT ABG SYR 3ML LUER SLIP (SYRINGE) IMPLANT
NEEDLE HYPO 22GX1.5 SAFETY (NEEDLE) IMPLANT
NEEDLE HYPO 25X5/8 SAFETYGLIDE (NEEDLE) IMPLANT
NS IRRIG 1000ML POUR BTL (IV SOLUTION) ×3 IMPLANT
PACK C SECTION WH (CUSTOM PROCEDURE TRAY) ×3 IMPLANT
PAD OB MATERNITY 4.3X12.25 (PERSONAL CARE ITEMS) ×3 IMPLANT
PENCIL SMOKE EVAC W/HOLSTER (ELECTROSURGICAL) ×6 IMPLANT
RTRCTR C-SECT PINK 25CM LRG (MISCELLANEOUS) ×3 IMPLANT
STRIP CLOSURE SKIN 1/2X4 (GAUZE/BANDAGES/DRESSINGS) ×2 IMPLANT
SUT CHROMIC 2 0 CT 1 (SUTURE) ×6 IMPLANT
SUT MNCRL 0 VIOLET CTX 36 (SUTURE) ×2 IMPLANT
SUT MONOCRYL 0 CTX 36 (SUTURE) ×4
SUT PDS AB 0 CTX 60 (SUTURE) ×3 IMPLANT
SUT PLAIN 2 0 (SUTURE)
SUT PLAIN ABS 2-0 CT1 27XMFL (SUTURE) IMPLANT
SUT VIC AB 0 CTX 36 (SUTURE) ×4
SUT VIC AB 0 CTX36XBRD ANBCTRL (SUTURE) ×2 IMPLANT
SUT VIC AB 4-0 KS 27 (SUTURE) ×3 IMPLANT
SYR CONTROL 10ML LL (SYRINGE) IMPLANT
TOWEL OR 17X24 6PK STRL BLUE (TOWEL DISPOSABLE) ×3 IMPLANT
TRAY FOLEY W/BAG SLVR 14FR LF (SET/KITS/TRAYS/PACK) ×3 IMPLANT
WATER STERILE IRR 1000ML POUR (IV SOLUTION) ×6 IMPLANT

## 2020-08-21 NOTE — Progress Notes (Addendum)
Brittany Leon is a 24 year old G1P0 at 38.5 weeks SROM 10/6 at 1730 Pitocin restarted Epidural in place   Subjective:    Comfortable w/ epidural.  Objective:    VS: BP 106/73   Pulse 97   Temp 98.8 F (37.1 C) (Oral)   Resp 16   Ht 5\' 2"  (1.575 m)   Wt 67.1 kg   LMP 11/24/2019   SpO2 100%   BMI 27.07 kg/m  FHR : baseline 130 / variability moderate / accelerations present, decels absent Toco: contractions every 2-5  minutes  Membranes: AROM 1730 10/6 Dilation: 5 Effacement (%): 80 Station: -1 Presentation: Vertex Exam by:: J Brittany Leon   Assessment/Plan:   24 y.o. G1P0 [redacted]w[redacted]d  Labor: Restart Pitocin for adequate MVUs Preeclampsia:  N/A Fetal Wellbeing:  Category I Pain Control:  Epidural I/D:  GBS negative Anticipated MOD:  NSVD  [redacted]w[redacted]d Brittany Tuohey MSN, CNM 08/21/2020 6:38 PM

## 2020-08-21 NOTE — Progress Notes (Signed)
Brittany Leon is a 24 year old G1P0 at 38.5 weeks SROM 10/6 at 1730 Pitocin infusing Epidural in place MVU adequate Called by RN for FHR review  Subjective:    Comfortable w/ epidural.  Objective:    VS: BP 117/76   Pulse 67   Temp 98.2 F (36.8 C) (Axillary)   Resp 16   Ht 5\' 2"  (1.575 m)   Wt 67.1 kg   LMP 11/24/2019   SpO2 100%   BMI 27.07 kg/m  FHR : baseline 130 / variability moderate / accelerations absent, repetitive deep variables, some late decelerations tToco: contractions every 1.5-2 minutes  Membranes: AROM 1730 Dilation: 5-6 Effacement (%): 80 Station: -1 Presentation: Vertex Exam by:: RN Pitocin 10 mU/min  Assessment/Plan:   24 y.o. G1P0 [redacted]w[redacted]d Reviewed Tracing with Dr. [redacted]w[redacted]d. Pitocin off. Begin Amnio.If no improvement, terb x 1 Labor: Progressing well on pitocin,  MVU's adequate Preeclampsia:  N/A Fetal Wellbeing:  Category II Pain Control:  Epidural I/D:  GBS negative Anticipated MOD:  NSVD  Mora Appl Nereyda Bowler MSN, CNM 08/21/2020 2:07 PM

## 2020-08-21 NOTE — Anesthesia Procedure Notes (Signed)
Epidural Patient location during procedure: OB Start time: 08/21/2020 3:10 AM End time: 08/21/2020 3:20 AM  Staffing Anesthesiologist: Elmer Picker, MD Performed: anesthesiologist   Preanesthetic Checklist Completed: patient identified, IV checked, risks and benefits discussed, monitors and equipment checked, pre-op evaluation and timeout performed  Epidural Patient position: sitting Prep: DuraPrep and site prepped and draped Patient monitoring: continuous pulse ox, blood pressure, heart rate and cardiac monitor Approach: midline Location: L3-L4 Injection technique: LOR air  Needle:  Needle type: Tuohy  Needle gauge: 17 G Needle length: 9 cm Needle insertion depth: 5 cm Catheter type: closed end flexible Catheter size: 19 Gauge Catheter at skin depth: 11 cm Test dose: negative  Assessment Sensory level: T8 Events: blood not aspirated, injection not painful, no injection resistance, no paresthesia and negative IV test  Additional Notes Patient identified. Risks/Benefits/Options discussed with patient including but not limited to bleeding, infection, nerve damage, paralysis, failed block, incomplete pain control, headache, blood pressure changes, nausea, vomiting, reactions to medication both or allergic, itching and postpartum back pain. Confirmed with bedside nurse the patient's most recent platelet count. Confirmed with patient that they are not currently taking any anticoagulation, have any bleeding history or any family history of bleeding disorders. Patient expressed understanding and wished to proceed. All questions were answered. Sterile technique was used throughout the entire procedure. Please see nursing notes for vital signs. Test dose was given through epidural catheter and negative prior to continuing to dose epidural or start infusion. Warning signs of high block given to the patient including shortness of breath, tingling/numbness in hands, complete motor block,  or any concerning symptoms with instructions to call for help. Patient was given instructions on fall risk and not to get out of bed. All questions and concerns addressed with instructions to call with any issues or inadequate analgesia.  Reason for block:procedure for pain

## 2020-08-21 NOTE — Brief Op Note (Signed)
08/21/2020  9:05 PM  PATIENT:  Brittany Leon  24 y.o. female  PRE-OPERATIVE DIAGNOSIS:  IUP @ 38 5/7 weeks, Failure to Progress, Fetal Intolerance to Labor, Prolonged Ruptured Membranes POST-OPERATIVE DIAGNOSIS:  Same, ROT, Asynclitic  PROCEDURE:  Procedure(s): CESAREAN SECTION (N/A), Primary, LTCS, 2 layer closure  SURGEON:  Surgeon(s) and Role:    Geryl Rankins, MD - Primary  PHYSICIAN ASSISTANT:   ASSISTANTS: Verdis Prime, CNM   ANESTHESIA:   epidural  EBL:  594 ml   BLOOD ADMINISTERED:none  DRAINS: Urinary Catheter (Foley)   LOCAL MEDICATIONS USED:  NONE  SPECIMEN:  Source of Specimen:  Placenta  DISPOSITION OF SPECIMEN:  PATHOLOGY  COUNTS:  YES  TOURNIQUET:  * No tourniquets in log *  DICTATION: .Other Dictation: Dictation Number [id number]  PLAN OF CARE: Transfer to postpartum after PACU  PATIENT DISPOSITION:  PACU - hemodynamically stable.   Delay start of Pharmacological VTE agent (>24hrs) due to surgical blood loss or risk of bleeding: no

## 2020-08-21 NOTE — Progress Notes (Addendum)
Brittany Leon is a 24 year old G1P0 at 38.5 weeks SROM 10/6 at 1730 Pitocin 8 mU Epidural in place To room for evaluation of FHR tracing  Subjective:    Comfortable w/ epidural.  Objective:    VS: BP 106/73   Pulse 97   Temp 98.8 F (37.1 C) (Oral)   Resp 16   Ht 5\' 2"  (1.575 m)   Wt 67.1 kg   LMP 11/24/2019   SpO2 100%   BMI 27.07 kg/m  FHR : baseline 140 / variability moderate / accelerations basent, decelerations repetitive deep variables/lates. Reviewed in real time with Dr. 01/22/2020: contractions every 2 minutes  Membranes: AROM 1730 10/6 Dilation: 5 Effacement (%): 80 Station: -1 Presentation: Vertex Exam by:: J Cameren Odwyer MVU adequate No cervical change x 6 hours  Assessment/Plan:   24 y.o. G1P0 [redacted]w[redacted]d  Labor: Pitocin off. CS called By Dr. [redacted]w[redacted]d for NRFHT and FTP. En route to hospital. Dr. Mora Appl made aware via Dr. Glenard Haring of patients status.  Preeclampsia:  N/A Fetal Wellbeing:  Category II Pain Control:  Epidural I/D:  GBS negative Anticipated MOD: CS called  Mora Appl MSN, CNM 08/21/2020 6:39 PM

## 2020-08-21 NOTE — Progress Notes (Signed)
Jazzmyne is a 24 year old G1P0 at 38.5 weeks SROM 10/6 at 1730 Pitocin infusing Epidural in place MVU adequate  Subjective:    Comfortable w/ epidural.  Objective:    VS: BP 140/86   Pulse 68   Temp 98.2 F (36.8 C) (Axillary)   Resp 16   Ht 5\' 2"  (1.575 m)   Wt 67.1 kg   LMP 11/24/2019   SpO2 100%   BMI 27.07 kg/m  FHR : baseline 130 / variability moderate / accelerations present / early decelerations Toco: contractions every 1.5-2 minutes  Membranes: AROM x13 hrs, remains clear Dilation: 5-6 Effacement (%): 80 Station: -1 Presentation: Vertex Exam by:: RN Pitocin 10 mU/min  Assessment/Plan:   24 y.o. G1P0 [redacted]w[redacted]d  Labor: Progressing well on pitocin,  MVU's adequate Preeclampsia:  N/A Fetal Wellbeing:  Category I Pain Control:  Epidural I/D:  GBS negative Anticipated MOD:  NSVD  [redacted]w[redacted]d Kron Everton MSN, CNM 08/21/2020 1:02 PM

## 2020-08-21 NOTE — Progress Notes (Signed)
Subjective:    Comfortable w/ epidural. Discussed IUPC placement and pt agrees.   Objective:    VS: BP 125/70    Pulse 63    Temp 98 F (36.7 C) (Oral)    Resp 18    Ht 5\' 2"  (1.575 m)    Wt 67.1 kg    LMP 11/24/2019    SpO2 100%    BMI 27.07 kg/m  FHR : baseline 130 / variability moderate / accelerations present / early decelerations Toco: contractions every 1.5-2 minutes  Membranes: AROM x13 hrs, remains clear Dilation: 3 Effacement (%): 70 Station: -2 Presentation: Vertex Exam by:: [email or web address], CNM Pitocin 10 mU/min  Assessment/Plan:   24 y.o. G1P0 [redacted]w[redacted]d  Labor: protracted latent phase, IUPC placed, titrate Pitocin to avoid tachysystole and maintain MVU's @ 180-200 Preeclampsia:  N/A Fetal Wellbeing:  Category I Pain Control:  Epidural I/D:  GBS negative Anticipated MOD:  NSVD  [redacted]w[redacted]d MSN, CNM 08/21/2020 6:26 AM

## 2020-08-21 NOTE — Transfer of Care (Signed)
Immediate Anesthesia Transfer of Care Note  Patient: Brittany Leon  Procedure(s) Performed: CESAREAN SECTION (N/A )  Patient Location: PACU  Anesthesia Type:Epidural  Level of Consciousness: awake, alert  and oriented  Airway & Oxygen Therapy: Patient Spontanous Breathing  Post-op Assessment: Report given to RN and Post -op Vital signs reviewed and stable  Post vital signs: Reviewed and stable  Last Vitals:  Vitals Value Taken Time  BP    Temp    Pulse 79 08/21/20 2111  Resp    SpO2 99 % 08/21/20 2111  Vitals shown include unvalidated device data.  Last Pain:  Vitals:   08/21/20 1901  TempSrc: Oral  PainSc:          Complications: No complications documented.

## 2020-08-21 NOTE — Anesthesia Preprocedure Evaluation (Signed)
Anesthesia Evaluation  Patient identified by MRN, date of birth, ID band Patient awake    Reviewed: Allergy & Precautions, NPO status , Patient's Chart, lab work & pertinent test results  Airway Mallampati: II  TM Distance: >3 FB Neck ROM: Full    Dental no notable dental hx.    Pulmonary neg pulmonary ROS, former smoker,    Pulmonary exam normal breath sounds clear to auscultation       Cardiovascular negative cardio ROS Normal cardiovascular exam Rhythm:Regular Rate:Normal     Neuro/Psych negative neurological ROS  negative psych ROS   GI/Hepatic negative GI ROS, Neg liver ROS,   Endo/Other  negative endocrine ROS  Renal/GU negative Renal ROS  negative genitourinary   Musculoskeletal negative musculoskeletal ROS (+)   Abdominal   Peds  Hematology negative hematology ROS (+)   Anesthesia Other Findings   Reproductive/Obstetrics (+) Pregnancy                             Anesthesia Physical Anesthesia Plan  ASA: II  Anesthesia Plan: Epidural   Post-op Pain Management:    Induction:   PONV Risk Score and Plan: Treatment may vary due to age or medical condition  Airway Management Planned: Natural Airway  Additional Equipment:   Intra-op Plan:   Post-operative Plan:   Informed Consent: I have reviewed the patients History and Physical, chart, labs and discussed the procedure including the risks, benefits and alternatives for the proposed anesthesia with the patient or authorized representative who has indicated his/her understanding and acceptance.     Plan Discussed with: Anesthesiologist  Anesthesia Plan Comments: (Patient identified. Risks, benefits, options discussed with patient including but not limited to bleeding, infection, nerve damage, paralysis, failed block, incomplete pain control, headache, blood pressure changes, nausea, vomiting, reactions to medication,  itching, and post partum back pain. Confirmed with bedside nurse the patient's most recent platelet count. Confirmed with the patient that they are not taking any anticoagulation, have any bleeding history or any family history of bleeding disorders. Patient expressed understanding and wishes to proceed. All questions were answered. )        Anesthesia Quick Evaluation  

## 2020-08-21 NOTE — Progress Notes (Signed)
Subjective:    Not coping well w/ ctx. Discussed pain management options. Pt desires IV sedation now and planning an epidural in active labor.   Objective:    VS: BP (!) 128/93   Pulse 74   Temp 98.1 F (36.7 C) (Oral)   Resp 15   Ht 5\' 2"  (1.575 m)   Wt 67.1 kg   LMP 11/24/2019   SpO2 100%   BMI 27.07 kg/m  FHR : baseline 145 / variability moderate / accelerations present / variable decelerations Toco: contractions every 2-4 minutes  Membranes: SROM since 1730 Dilation: 2 Effacement (%): 50 Station: -3 Presentation: Vertex Exam by:: [email or web address], CNM Pitocin 4 mU/min  Assessment/Plan:   24 y.o. G1P0 [redacted]w[redacted]d  Labor: Progressing on Pitocin Preeclampsia:  N/A Fetal Wellbeing:  Category I and Category II, Cat II resolved w/ position change Pain Control:  IV pain meds I/D:  GBS neg Anticipated MOD:  NSVD  [redacted]w[redacted]d MSN, CNM 08/21/2020 12:10 AM

## 2020-08-21 NOTE — Consult Note (Signed)
Neonatology Note:   Attendance at C-section:    I was asked by Dr. Pinn to attend this  C/S at term for decels and FTP. The mother is a G1, GBS neg with good prenatal care. ROM 27h 13m prior to delivery, fluid clear. Infant vigorous with good spontaneous cry and tone. Needed minimal bulb suctioning. +60 secc DCC.  Ap 8/9. Lungs clear to ausc in DR. Family updated.  To CN to care of Pediatrician.  Ra Pfiester C. Maudine Kluesner, MD  

## 2020-08-22 ENCOUNTER — Encounter (HOSPITAL_COMMUNITY): Payer: Self-pay | Admitting: Obstetrics and Gynecology

## 2020-08-22 DIAGNOSIS — D62 Acute posthemorrhagic anemia: Secondary | ICD-10-CM

## 2020-08-22 LAB — CBC
HCT: 30.1 % — ABNORMAL LOW (ref 36.0–46.0)
Hemoglobin: 9.6 g/dL — ABNORMAL LOW (ref 12.0–15.0)
MCH: 25.5 pg — ABNORMAL LOW (ref 26.0–34.0)
MCHC: 31.9 g/dL (ref 30.0–36.0)
MCV: 80.1 fL (ref 80.0–100.0)
Platelets: 271 10*3/uL (ref 150–400)
RBC: 3.76 MIL/uL — ABNORMAL LOW (ref 3.87–5.11)
RDW: 13.7 % (ref 11.5–15.5)
WBC: 26.4 10*3/uL — ABNORMAL HIGH (ref 4.0–10.5)
nRBC: 0 % (ref 0.0–0.2)

## 2020-08-22 MED ORDER — POLYSACCHARIDE IRON COMPLEX 150 MG PO CAPS
150.0000 mg | ORAL_CAPSULE | Freq: Every day | ORAL | Status: DC
  Administered 2020-08-22 – 2020-08-24 (×3): 150 mg via ORAL
  Filled 2020-08-22 (×3): qty 1

## 2020-08-22 MED ORDER — MAGNESIUM OXIDE 400 (241.3 MG) MG PO TABS
400.0000 mg | ORAL_TABLET | Freq: Every day | ORAL | Status: DC
  Administered 2020-08-22 – 2020-08-24 (×3): 400 mg via ORAL
  Filled 2020-08-22 (×3): qty 1

## 2020-08-22 NOTE — Lactation Note (Addendum)
This note was copied from a baby's chart. Lactation Consultation Note Baby 7 hrs old. Mom was trying to BF when LC entered room. Mom was placing him beside her in bed. Sit mom upright positioned w/pillows.  Mom has compressible everted nipples. Taught mom hand expression w/colostrum easily expressed. Attempted to BF baby wasn't interested. Wouldn't open or respond to BF. LC attempted to spoon feed colostrum. Baby wouldn't spoon feed. LC attempted suck training w/gloved finger, baby wouldn't suckle. Noted baby has high palate.  Baby felt cold. Called for baby to get temp checked. Temp. WDL. Baby swaddled in 2 blankets, hat on head.   Newborn behavior, feeding habits, STS, I&O, milk storage, positioning, support, supply and demand discussed.  Baby had large sputum of clear water looking fluid w/spot of colostrum and mucous.. LC used bulb syring to clear mouth. Teaching mom as LC performed.  Encouraged mom to attempt to feed every 3 hrs if baby hasn't cued to feed. Mom encouraged to feed baby 8-12 times/24 hours and with feeding cues.   Encouraged mom to call for assistance or questions.  Lactation brochure given.  Patient Name: Brittany Leon CHYIF'O Date: 08/22/2020 Reason for consult: Initial assessment;Early term 37-38.6wks   Maternal Data Has patient been taught Hand Expression?: Yes Does the patient have breastfeeding experience prior to this delivery?: No  Feeding Feeding Type: Breast Milk  LATCH Score Latch: Too sleepy or reluctant, no latch achieved, no sucking elicited.  Audible Swallowing: None  Type of Nipple: Everted at rest and after stimulation  Comfort (Breast/Nipple): Soft / non-tender  Hold (Positioning): Full assist, staff holds infant at breast  LATCH Score: 4  Interventions Interventions: Breast feeding basics reviewed;Assisted with latch;Breast compression;Skin to skin;Adjust position;Breast massage;Support pillows;Hand express;Position  options;Expressed milk  Lactation Tools Discussed/Used WIC Program: No   Consult Status Consult Status: Follow-up Date: 08/22/20 Follow-up type: In-patient    Brittany Leon, [name] 08/22/2020, 3:26 AM

## 2020-08-22 NOTE — Anesthesia Postprocedure Evaluation (Signed)
Anesthesia Post Note  Patient: Brittany Leon  Procedure(s) Performed: CESAREAN SECTION (N/A )     Patient location during evaluation: PACU Anesthesia Type: Epidural Level of consciousness: awake and alert Pain management: pain level controlled Vital Signs Assessment: post-procedure vital signs reviewed and stable Respiratory status: spontaneous breathing, nonlabored ventilation and respiratory function stable Cardiovascular status: stable Postop Assessment: no headache, no backache and epidural receding Anesthetic complications: no   No complications documented.  Last Vitals:  Vitals:   08/22/20 0210 08/22/20 0400  BP: 115/76 120/81  Pulse: 78   Resp: 16 17  Temp: 37.2 C   SpO2: 100% 100%    Last Pain:  Vitals:   08/22/20 0210  TempSrc:   PainSc: 0-No pain   Pain Goal: Patients Stated Pain Goal: 3 (08/21/20 2145)                 Chaun Uemura L Tiea Manninen

## 2020-08-22 NOTE — Lactation Note (Signed)
This note was copied from a baby's chart. Lactation Consultation Note  Patient Name: Brittany Leon IZTIW'P Date: 08/22/2020 Reason for consult: Follow-up assessment;Primapara;1st time breastfeeding;Early term 37-38.6wks  Follow up visit to 17 hours old infant of a P1 mother. Mother states infant just finished breastfeeding. Infant is sleeping in basinet. Mother denies pain or discomfort when breastfeeding. Breastfeeding is going well, per mother. Infant seems to have a good output per age.  Mother inquires about medicaid and WIC. Encouraged mother to contact her SW and ask for assistance.  Encouraged mother to contact lactation for support, questions or concerns.    Maternal Data Formula Feeding for Exclusion: No Does the patient have breastfeeding experience prior to this delivery?: No  Feeding Feeding Type: Breast Fed  Interventions Interventions: Breast feeding basics reviewed  Consult Status Consult Status: Follow-up Date: 08/23/20 Follow-up type: In-patient    Brittany Leon A Higuera Ancidey 08/22/2020, 2:05 PM

## 2020-08-22 NOTE — Progress Notes (Signed)
Subjective: POD# 1 Live born female  Birth Weight: 6 lb 3.7 oz (2825 g) APGAR: 8, 9  Newborn Delivery   Birth date/time: 08/21/2020 20:13:00 Delivery type: C-Section, Low Transverse Trial of labor: Yes C-section categorization: Primary     [name] Delivering provider: Geryl Rankins   Circumcision: Yes, planning Feeding: breast  Pain control at delivery: Epidural   Reports feeling well with no pain. Breastfeeding going well. Bleeding is moderate. Foley was removed this AM but patient has not been up to the bathroom.  Patient reports tolerating PO.   Breast symptoms:None Pain controlled with ibuprofen (OTC), prescription NSAID's including Toradol and narcotic analgesics including Oxy IR Denies HA/SOB/C/P/N/V/dizziness. Flatus no. She reports vaginal bleeding as normal, without clots.  She is ambulating and will attempt urination this AM, foley has been removed.      Objective:  Vitals:   08/22/20 0008 08/22/20 0108 08/22/20 0210 08/22/20 0400  BP: 111/69 121/78 115/76 120/81  Pulse: 76 80 78   Resp: 16 17 16 17   Temp: 98 F (36.7 C) 98.1 F (36.7 C) 98.9 F (37.2 C)   TempSrc:      SpO2: 97% 100% 100% 100%  Weight:      Height:         Intake/Output Summary (Last 24 hours) at 08/22/2020 1011 Last data filed at 08/22/2020 0545 Gross per 24 hour  Intake 1300 ml  Output 2994 ml  Net -1694 ml     Recent Labs    08/20/20 1911 08/22/20 0547  WBC 9.5 26.4*  HGB 11.0* 9.6*  HCT 34.3* 30.1*  PLT 352 271    Blood type: --/--/O POS (10/06 1910)  Rubella: Immune, Immune (05/21 0000)    Physical Exam:  General: alert and cooperative CV: Regular rate and rhythm Resp: clear Abdomen: soft, nontender, normal bowel sounds Incision: clean, dry and intact Uterine Fundus: firm, below umbilicus, nontender Lochia: moderate Ext: extremities normal, atraumatic, no cyanosis or edema and no edema, redness or tenderness in the calves or thighs  Assessment/Plan: 24  y.o.   POD# 1. G1P1001                  Principal Problem:   Postpartum care following cesarean delivery 10/7 Active Problems:   Status post primary low transverse cesarean section - FTP, NRFHTs 10/7  Encourage rest when baby rests  Breastfeeding support  Encourage to ambulate  Routine post-op care   Anemia associated with acute blood loss  Start Niferex and mag oxide, pt is asymptomatic  Anticipate discharge on PO day #3.   12/7, CNM, MSN 08/22/2020, 10:11 AM

## 2020-08-23 NOTE — Lactation Note (Signed)
This note was copied from a baby's chart. Lactation Consultation Note  Patient Name: Brittany Leon Date: 08/23/2020 [name] now 65 hours old. Early term .  Mostly shorter feeds. Usually 10-15 minutes.  LC entered room and mom breastfeeding with infant swaddled with two blankets and an outfit. Mom reports he has been very sleepy since he was circumsized. Inquired about hand expression and spoon feeding.  Mom reports she has done it once.  Mom reports poops are transitioning to green. Urged mom to start doing some hand expression and spoon feeding past breastfeeding. Demo it again and mom able to hand express some drops easily.  Urged mom to unswaddle him to help keep him awake to feed.  Mom reports he gets cold.  Urged her to keep him next to her. And then put a blanket on his backside if she felt he was cold.  Showed mom football position.  Mom has somewhat pendulous breasts so thought football might be easier for her.  Infant latched easily but quickly fell asleep. Gave mom manual pump and demo how to use it.  Mom has bottles in her room with water.  Inquired about them and mom said she was washing them .  Urged to feed on cue and 8-12 or more times day.  Urged mom to call lactation as needed.   Brittany Leon 08/23/2020, 11:30 PM

## 2020-08-23 NOTE — Progress Notes (Signed)
Subjective: POD# 2, 1LTCS for Failure to Progress, Fetal Intolerance to Labor, PROM Live born female  Birth Weight: 6 lb 3.7 oz (2825 g) APGAR: 8, 9  Newborn Delivery   Birth date/time: 08/21/2020 20:13:00 Delivery type: C-Section, Low Transverse Trial of labor: Yes C-section categorization: Primary     Baby name: Marti Sleigh Delivering provider: Geryl Rankins   circumcision completed Feeding: breast  Pain control at delivery: Epidural   Reports feeling sore this AM, more pain than yesterday  Patient reports tolerating PO.   Breast symptoms: none Pain controlled with acetaminophen, ibuprofen (OTC) and oxycodone Denies HA/SOB/C/P/N/V/dizziness. Flatus present. She reports vaginal bleeding as normal, without clots.  She is ambulating, urinating without difficulty.     Objective:   VS:    Vitals:   08/22/20 1100 08/22/20 1449 08/22/20 2143 08/23/20 0740  BP: 128/82 104/84 127/80 117/74  Pulse:  63 70 65  Resp: 17 20 19 17   Temp: 98.3 F (36.8 C)  98.1 F (36.7 C) 98 F (36.7 C)  TempSrc: Oral Oral Oral Oral  SpO2: 99% 99%    Weight:      Height:          Intake/Output Summary (Last 24 hours) at 08/23/2020 10/23/2020 Last data filed at 08/22/2020 1506 Gross per 24 hour  Intake --  Output 200 ml  Net -200 ml        Recent Labs    08/20/20 1911 08/22/20 0547  WBC 9.5 26.4*  HGB 11.0* 9.6*  HCT 34.3* 30.1*  PLT 352 271     Blood type: --/--/O POS (10/06 1910)  Rubella: Immune, Immune (05/21 0000)  Vaccines: TDaP          UTD         Flu             Prior to DC                       Physical Exam:  General: alert, cooperative and no distress Abdomen: soft, nontender, normal bowel sounds Incision: clean, dry and intact Uterine Fundus: firm, U+1, nontender Lochia: minimal Ext: no edema, redness or tenderness in the calves or thighs      Assessment/Plan: 24 y.o.   POD# 2. G1P1001                  Principal Problem:   Postpartum care following cesarean  delivery 10/7 Active Problems:   Status post primary low transverse cesarean section - FTP, NRFHTs 10/7   Anemia associated with acute blood loss  - Asymptomatic  - started oral Fe and Mag Ox  Doing well, stable.              Encourage rest when baby rests Breastfeeding support Encourage to ambulate Routine post-op care Anticipate DC tomorrow  12/7, CNM, MSN 08/23/2020, 9:21 AM

## 2020-08-24 ENCOUNTER — Encounter (HOSPITAL_COMMUNITY): Payer: Self-pay | Admitting: Obstetrics and Gynecology

## 2020-08-24 MED ORDER — POLYSACCHARIDE IRON COMPLEX 150 MG PO CAPS: 150.0000 mg | ORAL_CAPSULE | Freq: Every day | ORAL | 1 refills | Status: AC

## 2020-08-24 MED ORDER — MAGNESIUM OXIDE 400 (241.3 MG) MG PO TABS: 400.0000 mg | ORAL_TABLET | Freq: Every day | ORAL | 1 refills | Status: AC

## 2020-08-24 MED ORDER — IBUPROFEN 800 MG PO TABS: 800.0000 mg | ORAL_TABLET | Freq: Three times a day (TID) | ORAL | 0 refills | Status: AC

## 2020-08-24 MED ORDER — ACETAMINOPHEN 500 MG PO TABS: 1000.0000 mg | ORAL_TABLET | Freq: Four times a day (QID) | ORAL | 0 refills | Status: AC

## 2020-08-24 MED ORDER — OXYCODONE HCL 5 MG PO TABS: 5.0000 mg | ORAL_TABLET | ORAL | 0 refills | Status: AC | PRN

## 2020-08-24 NOTE — Progress Notes (Signed)
CSW received consult due to  [facility] presenting as tearful and having limited supports. CSW went to speak with MOB at bedside to address further needs and offer further support.    CSW congratulated MOB and FOB on the birth of infant. CSW asked MOB if she spoke [place] or if she'd like for CSW to get interpretor . MOB expressed that she understood English but wanted to be sure that she understood the information given to her, therefore CSW used Swahili interpretor [phone] to speak with MOB. CSW advised MOB of HIPPA policy and asked that FOB leave room. FOB expressed "its my baby to so I need to hear about it". CSW advise FOB that CSW would only be speaking with MOB about MOB and not infant. FOB appeared upset however left room with no issues. CSW advised MO of CSW's role  and the reason for CSW coming to speak with her. MOB expressed no mental health hx and expressed no current concerns. CSW asked MOB how she has been feeling since she gave in which MOB expressed that she's bee feeling "happy". CSW asked MOB about who her supports were in which MOB expressed that she has FOB, her sister and "social services". MOB reported to this CSW that she has Case Worker with [facility]) that helps her. MOB expressed that when she needs things she often times can reach out to [place] to get help. CSW understanding and asked MOB about how she has been feeling about caring for infant. MOB expressed "baby is a lot of work but I have dad and my sister to help me. I think I will need more help at times thought". CSW understanding and offered MOB Health Start Ref in which MOB expressed that she would be interested in. CSW advised MOB that [facility] is not a around the clock services but would be able to assist MOB with any questions or other resources in the community if MOB feels that she needs it., MOB voiced understanding of this. CSW inquired from [facility] on items for infant in which MOB expressed  that she has all items but wanted to get milk "but wasn't sure which kind to get". CSW was notified by MOB that she has been breast feeding so far but is interested in getting milk also. CSW encouraged MOB to asked RN or Peds what milk would be best for infant. CSW asked MOB about transportation needs in which MOB reported that FOB drive "and when I was working I took Clinical research associate and [employer] but since Im not working, I will have to ask around". CSW encouraged  MOB to apply for Medicaid transportation or speak with  Victorino Dike to see what other options MOB may have regarding transportation needs. MOB expressed that she has no other needs. MOB does reports that she plans to call and get WIC establish on 08/25/20.   MOB was given PPD and SIDS education. CSW provided education regarding Baby Blues vs PMADs and provided MOB with resources for mental health follow up.  CSW encouraged MOB to evaluate her mental health throughout the postpartum period with the use of the New Mom Checklist developed by Postpartum [employer] as well as the [place] Postnatal Depression Scale and notify a medical professional if symptoms arise.   MOB reported that she has a crib for infant to sleep in once arrived home. CSW attempted to follow up with MOB once amore about feelings and then MOB began to cry .CSW asked MOB how she  was feeling and MOB reported "im just so happy, that's why I cry". MOB assured CSW that she was not crying due to feeling sad or anything but "im just happy and joyful baby is here". CSW understanding and noted no further needs.   CSW will make [facility] referral as well as Production manager  for MOB at this time.  During this assessment MOB was engaged and asked questions as needed.    Claude Manges Prarthana Parlin, MSW, LCSW [facility] [phone]

## 2020-08-24 NOTE — Lactation Note (Signed)
This note was copied from a baby's chart. Lactation Consultation Note  Patient Name: Brittany Leon EUMPN'T Date: 08/24/2020 Reason for consult: Follow-up assessment   Mother is a P38, infant is 35 hours old and is now at 7 % wt loss.   Mother was observed with infant latched on at the left breast. Observed infant suckling with audible swallows. Infant sustained latch for 20 mins.  Discussed treatment and prevention of engorgement.   Plan of Care : Breastfeed infant with feeding cues Supplement infant with ebm/formula, according to supplemental guidelines. Pump using a DEBP after each feeding for 15-20 mins.   Mother to continue to cue base feed infant and feed at least 8-12 times or more in 24 hours and advised to allow for cluster feeding infant as needed.  Mother to continue to due STS. Mother is aware of available LC services at [facility], BFSG'S, OP Dept, and phone # for questions or concerns about breastfeeding.  Mother receptive to all teaching and plan of care.     Maternal Data    Feeding Feeding Type: Breast Fed  LATCH Score Latch: Grasps breast easily, tongue down, lips flanged, rhythmical sucking.  Audible Swallowing: Spontaneous and intermittent  Type of Nipple: Everted at rest and after stimulation  Comfort (Breast/Nipple): Soft / non-tender  Hold (Positioning): Assistance needed to correctly position infant at breast and maintain latch.  LATCH Score: 9  Interventions Interventions: Breast feeding basics reviewed;Assisted with latch;Skin to skin;Breast compression;Support pillows;Position options;Hand pump  Lactation Tools Discussed/Used     Consult Status Consult Status: Complete    Michel Bickers 08/24/2020, 2:39 PM

## 2020-08-24 NOTE — Lactation Note (Addendum)
This note was copied from a baby's chart. Lactation Consultation Note Baby 89 hrs old.  LC attempted to see mom. Lights on in room. Mom sleeping. FOB awake watching baby.  Patient Name: Brittany Leon GOTLX'B Date: 08/24/2020 Reason for consult: Follow-up assessment   Maternal Data Has patient been taught Hand Expression?: Yes  Feeding Feeding Type: Breast Fed  LATCH Score Latch: Grasps breast easily, tongue down, lips flanged, rhythmical sucking.  Audible Swallowing: A few with stimulation  Type of Nipple: Everted at rest and after stimulation  Comfort (Breast/Nipple): Soft / non-tender  Hold (Positioning): Assistance needed to correctly position infant at breast and maintain latch.  LATCH Score: 8  Interventions Interventions: Breast feeding basics reviewed;Assisted with latch;Hand express;Position options;Expressed milk;Hand pump  Lactation Tools Discussed/Used     Consult Status Consult Status: Follow-up Date: 08/23/20 Follow-up type: In-patient    Brittany Leon, [name] 08/24/2020, 2:57 AM

## 2020-08-24 NOTE — Discharge Summary (Signed)
OB Discharge Summary  Patient Name: Brittany Leon DOB: 1996/08/11 MRN: [id number]  Date of admission: 08/20/2020 Delivering provider: Geryl Rankins   Admitting diagnosis: Normal labor [O80, Z37.9] Intrauterine pregnancy: [redacted]w[redacted]d     Secondary diagnosis: Patient Active Problem List   Diagnosis Date Noted  . Anemia associated with acute blood loss 08/22/2020  . Status post primary low transverse cesarean section - FTP, NRFHTs 10/7 08/21/2020  . Postpartum care following cesarean delivery 10/7 08/21/2020    Date of discharge: 08/24/2020   Discharge diagnosis: Principal Problem:   Postpartum care following cesarean delivery 10/7 Active Problems:   Status post primary low transverse cesarean section - FTP, NRFHTs 10/7   Anemia associated with acute blood loss                                                            Post partum procedures:None  Augmentation: Pitocin Pain control: Epidural  Laceration:None  Episiotomy:None  Complications: None  Hospital course:  Onset of Labor With Unplanned C/S   24 y.o. yo G1P1001 at [redacted]w[redacted]d was admitted in Latent Labor on 08/20/2020. Patient had a labor course significant for fetal distress. The patient went for cesarean section due to Non-Reassuring FHR. Delivery details as follows: Membrane Rupture Time/Date: 5:00 PM ,08/20/2020   Delivery Method:C-Section, Low Transverse  Details of operation can be found in separate operative note. Patient had a postpartum course co,plicated by anemia. She was started on PO iron and will be discharged with a prescription for Ferrex PO. She is ambulating, tolerating a regular diet, passing flatus, and urinating well.  Patient is discharged home in stable condition 08/24/20.  Newborn Data: Birth date:08/21/2020  Birth time:8:13 PM  Gender:Female  Living status:Living  Apgars:8 ,9  Weight:2825 g   Physical exam  Vitals:   08/23/20 1524 08/23/20 1600 08/23/20 1909 08/24/20 0436  BP: (!) 132/106 110/70 125/72  124/68  Pulse: 93 76 72 61  Resp: 17 17 15 16   Temp: 98.1 F (36.7 C)  98 F (36.7 C) 98.1 F (36.7 C)  TempSrc: Oral  Oral Oral  SpO2: 100%  100% 100%  Weight:      Height:       General: alert, cooperative and no distress Lochia: appropriate Uterine Fundus: firm Incision: Healing well with no significant drainage, Dressing is clean, dry, and intact DVT Evaluation: No evidence of DVT seen on physical exam. No significant calf/ankle edema. Labs: Lab Results  Component Value Date   WBC 26.4 (H) 08/22/2020   HGB 9.6 (L) 08/22/2020   HCT 30.1 (L) 08/22/2020   MCV 80.1 08/22/2020   PLT 271 08/22/2020   No flowsheet data found. Edinburgh Postnatal Depression Scale Screening Tool 08/23/2020  I have been able to laugh and see the funny side of things. 0  I have looked forward with enjoyment to things. 0  I have blamed myself unnecessarily when things went wrong. 0  I have been anxious or worried for no good reason. 0  I have felt scared or panicky for no good reason. 0  Things have been getting on top of me. 1  I have been so unhappy that I have had difficulty sleeping. 0  I have felt sad or miserable. 0  I have been so unhappy that I have been crying. 0  The thought of harming myself has occurred to me. 0  Edinburgh Postnatal Depression Scale Total 1   Discharge instructions:  per After Visit Summary   After Visit Meds:  Allergies as of 08/24/2020   No Known Allergies     Medication List    TAKE these medications   acetaminophen 500 MG tablet Commonly known as: TYLENOL Take 2 tablets (1,000 mg total) by mouth every 6 (six) hours.   ibuprofen 800 MG tablet Commonly known as: ADVIL Take 1 tablet (800 mg total) by mouth every 8 (eight) hours.   iron polysaccharides 150 MG capsule Commonly known as: Ferrex 150 Take 1 capsule (150 mg total) by mouth daily.   magnesium oxide 400 (241.3 Mg) MG tablet Commonly known as: MAG-OX Take 1 tablet (400 mg total) by mouth  daily. Start taking on: August 25, 2020   multivitamin-prenatal 27-0.8 MG Tabs tablet Take 1 tablet by mouth daily at 12 noon.   oxyCODONE 5 MG immediate release tablet Commonly known as: Oxy IR/ROXICODONE Take 1-2 tablets (5-10 mg total) by mouth every 4 (four) hours as needed for moderate pain.      Diet: routine diet  Activity: Advance as tolerated. Pelvic rest for 6 weeks.   Newborn Data: Live born female  Birth Weight: 6 lb 3.7 oz (2825 g) APGAR: 8, 9  Newborn Delivery   Birth date/time: 08/21/2020 20:13:00 Delivery type: C-Section, Low Transverse Trial of labor: Yes C-section categorization: Primary     Named Marti Sleigh Baby Feeding: Breast Disposition:home with mother  Delivery Report:  Review the Delivery Report for details.    Follow up:  Follow-up Information    [facility]. Schedule an appointment as soon as possible for a visit in 2 week(s).   Specialty: Obstetrics and Gynecology Why: Please make an appointment for 2 weeks postpartum.  Contact information: [address] [place] [address] [phone]             June Leap, CNM, MSN 08/24/2020, 1:47 PM

## 2020-08-25 LAB — SURGICAL PATHOLOGY

## 2020-08-25 NOTE — Op Note (Signed)
NAME: Brittany Leon, Brittany Leon MEDICAL RECORD [id number] ACCOUNT [id number] DATE OF BIRTH:1996/10/01 FACILITY: [facility] LOCATION: [facility] PHYSICIAN:Devonia Farro Derrell Lolling, MD  OPERATIVE REPORT  DATE OF PROCEDURE:  08/21/2020  PREOPERATIVE DIAGNOSES:  Intrauterine pregnancy at 74 and 5/7 weeks, failure to progress, fetal intolerance to labor, prolonged ruptured membranes.  POSTOPERATIVE DIAGNOSES:  Intrauterine pregnancy at 77 and 5/7 weeks, failure to progress, fetal intolerance to labor, prolonged ruptured membranes, right occiput transverse presentation and asynclitic.  PROCEDURE:  Primary low transverse cesarean section with 2-layer closure.  SURGEON:  Geryl Rankins, MD  ASSISTANT:  Verdis Prime, certified nurse midwife  ANESTHESIA:  Epidural.  ESTIMATED BLOOD LOSS:  594.  BLOOD ADMINISTERED:  None.  DRAINS:  Foley catheter.  SPECIMEN:  Placenta.  DISPOSITION OF SPECIMEN:  To pathology.  PATIENT DISPOSITION:  To PACU, hemodynamically stable.  FINDINGS:  Viable female infant, APGAR 8,9, in the ROT presentation and also asynclitic and a short umbilical cord, clear fluid noted.  Normal uterus and normal fallopian tubes bilaterally.  Left ovary is normal.  Right ovary not well visualized, but palpated normal.  DESCRIPTION OF PROCEDURE:  The patient was consented by Dr. Essie Hart for a primary C-section.  I arrived after she was consented.  The patient denied having any questions.  She was then taken to the operating room and epidural anesthesia was optimized.   She was prepped and draped in a normal sterile fashion.  A timeout was performed.  SCDs were on her legs and operating.  Ancef 2 grams IV was administered.  Allis clamp was used to confirm anesthesia after the patient was draped.  Abdomen was marked for a Pfannenstiel skin incision 2 cm above the symphysis pubis.  The scalpel was then used to make the incision on the skin of the Pfannenstiel incision.  The  incision was carried  down to the underlying layer of the fascia with the Bovie.  Fascia was then incised with the Bovie and the fascial incision was extended laterally with the curved Mayo scissors.  Kocher clamps were used to bluntly dissect the rectus  muscles off of the fascia.  There was a piece of the fascia that was torn on the lower right hand side.  A small piece smaller than the diameter of a quarter was left on the rectus muscles.  Muscles were then separated at the midline.  Peritoneum was tented up x2 and entered sharply with the Metzenbaum scissors.  There was a lot of copious peritoneal fluid that was yellow, not blood-tinged, not particulate and not foul odor.  That peritoneal  incision was then stretched.  Uterus was palpated.  Alexis retractor was then placed and the lower uterine segment was then exposed appropriately.  The serosa of the uterus was tented up with the Russians and incised with the Metzenbaum scissors to develop the bladder flap.  A transverse incision was then made with a scalpel and extended with the bandage scissors.  Clear fluid was noted.  The ear  was seen at the midline of the incision.  The baby's head appeared to be flexed at an awkward angle.  The head was then easily delivered to the incision and nose and mouth was suctioned and the shoulders and arms easily followed.  Baby was suctioned on  the field.  The umbilical cord was short.  After delayed cord clamping, the umbilical cord was clamped x2 and cut.  Baby was handed off to the awaiting NICU team.  Cord blood was obtained.  Hysterotomy edges were  then cut with a ring forceps and the  placenta was then delivered manually.  The uterus was cleared of all clots and debris with a moist laparotomy sponge x2.  Then 0 Vicryl was used to close the hysterotomy incision.  Second layer of the same suture was used for imbrication.  There was 1 area of bleeding at the midline that was made hemostatic with a U-stitch.  The adnexa was  visualized.  The gutters were  cleared of all clots and debris with the saline.  Interceed was applied at the incision.  An Alexis retractor was then removed.  Peritoneum was then reapproximated with 2-0 Vicryl in a continuous fashion.  The fascia was then reapproximated with 0 Vicryl  in a continuous fashion.  The subcutaneous space was reapproximated with 2-0 plain gut in a continuous fashion and the skin was reapproximated with 4-0 Monocryl in a subcuticular fashion with a Z-stitch on the patient's right.  Before the closure of  each layer hemostasis was assured and irrigation was performed.  All instrument, sponge and needle counts were correct x3.  During the procedure, the patient was agitated.  She said that her mouth felt odd.  She felt like she was having difficulty speaking, although she was well understood and she wanted to sit up.   Ultimately anesthesia addressed the situation and gave her something to decrease the chattering of her mouth.  The patient was speaking throughout the whole thing.  Denied any pain at all and appeared very comfortable while we were manipulating the  uterus during closure.  Once she received the medication patient was calm and relaxed and was able to rest quietly.  Baby remained in the operating room the entire time in stable condition.  CN/NUANCE  D:08/21/2020 T:08/22/2020 JOB:[id number]

## 2020-10-10 IMAGING — US US MFM OB COMP +14 WKS
1 series · 13 of 28 positions shown · non-contrast
Comparison: none

[Series 1: us mfm ob comp +14 wks · 13 of 103 slices shown]
[im 4/103]
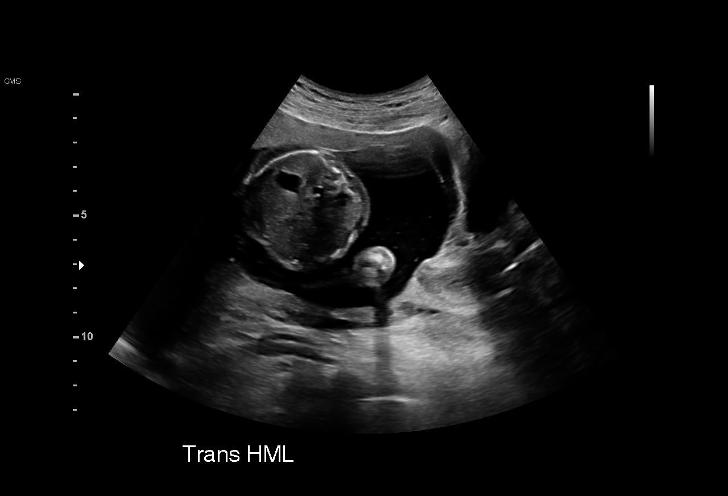
[im 12/103]
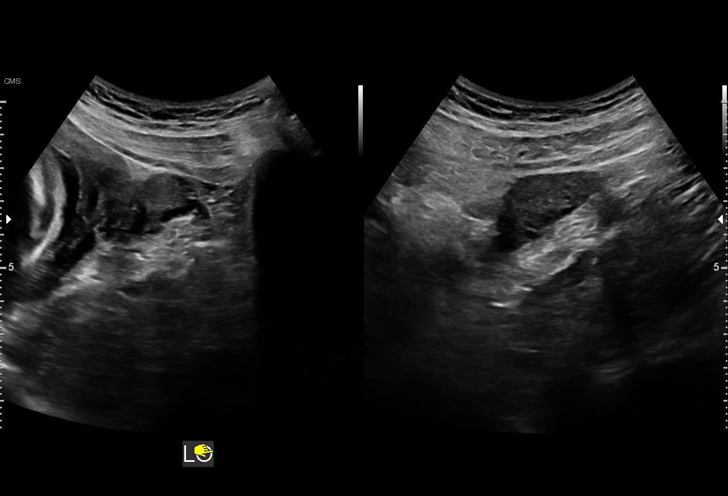
[im 19/103]
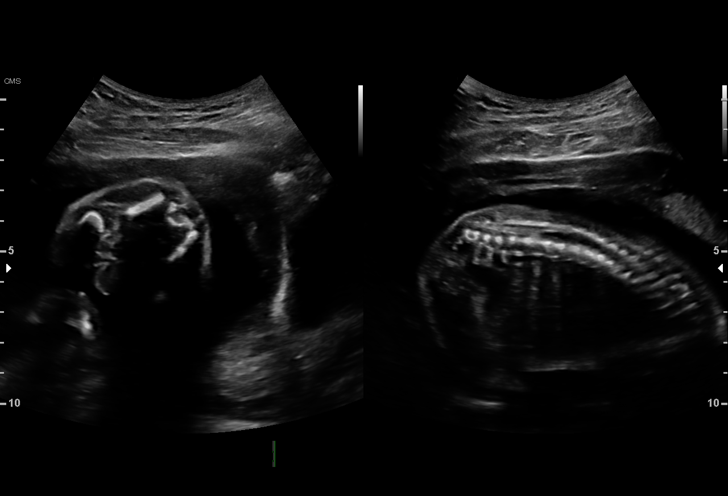
[im 27/103]
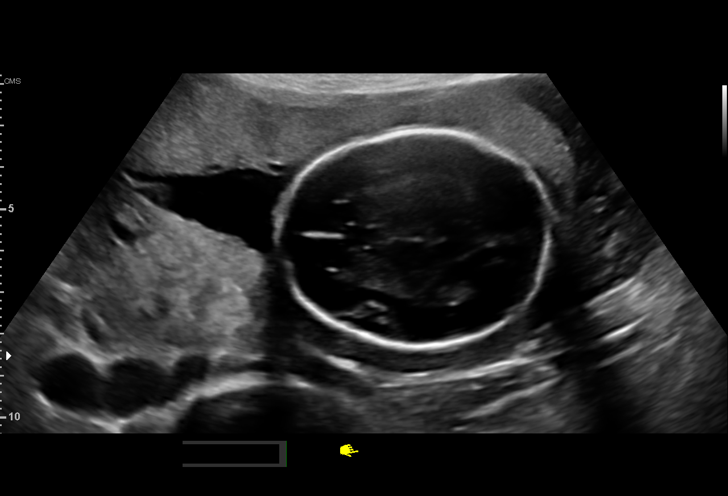
[im 35/103]
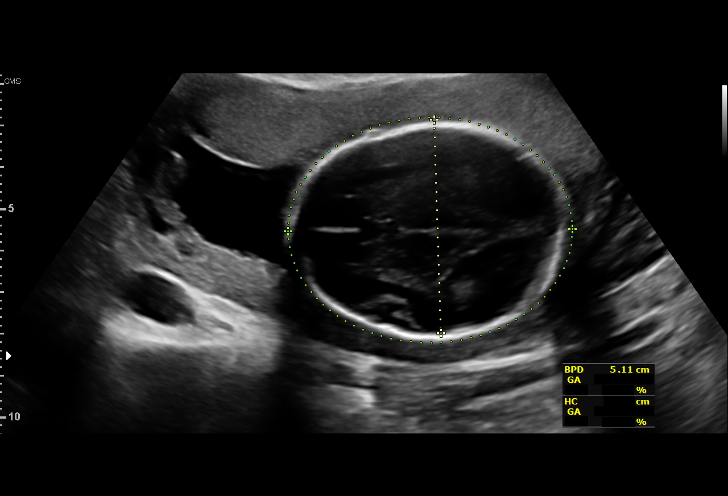
[im 42/103]
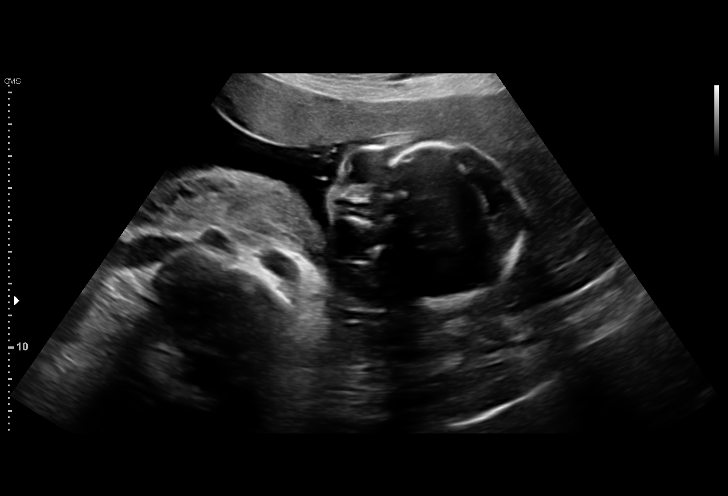
[im 53/103]
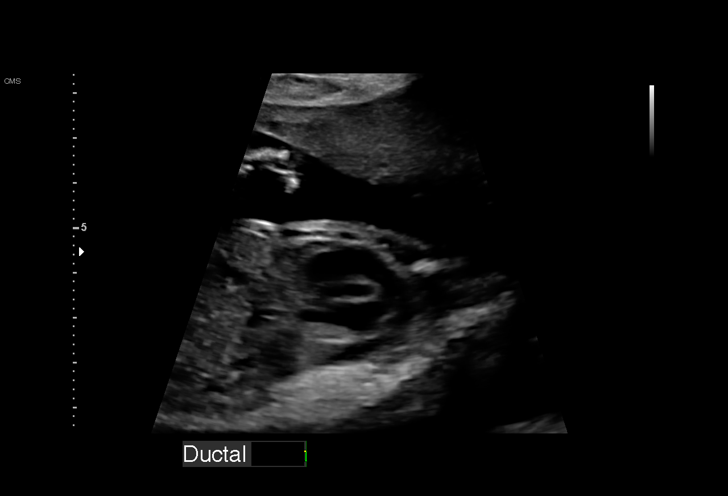
[im 61/103]
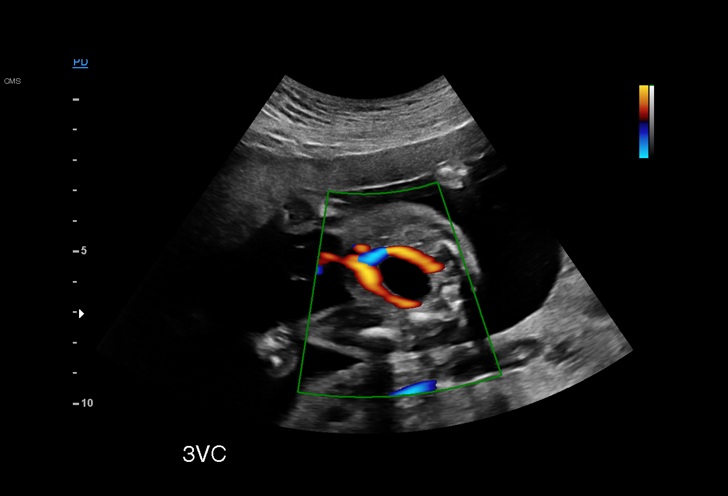
[im 69/103]
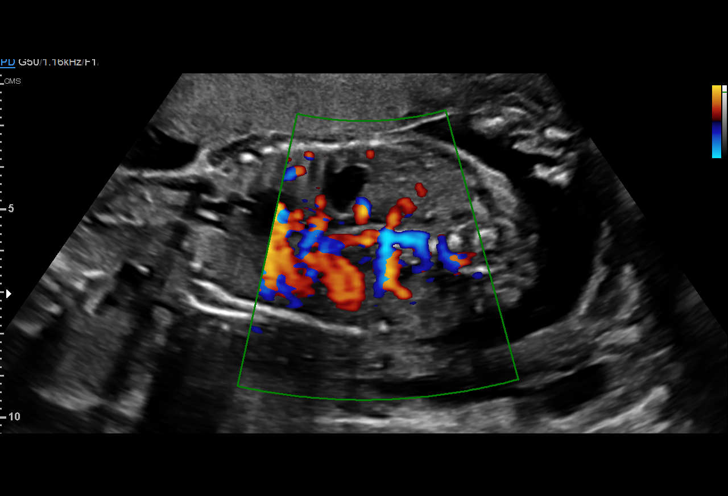
[im 76/103]
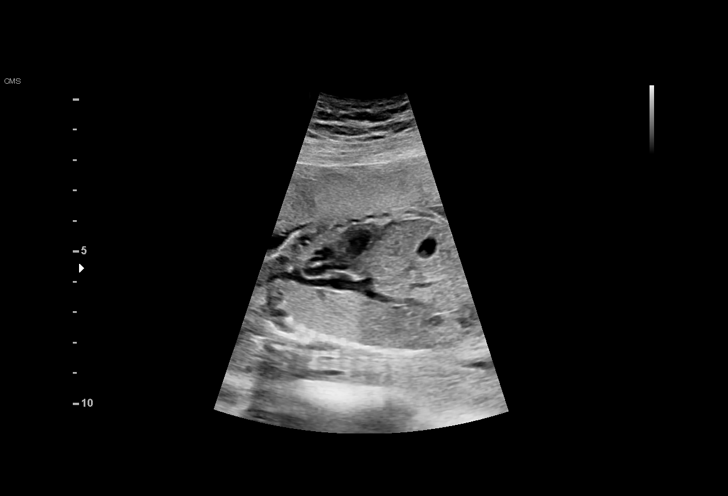
[im 84/103]
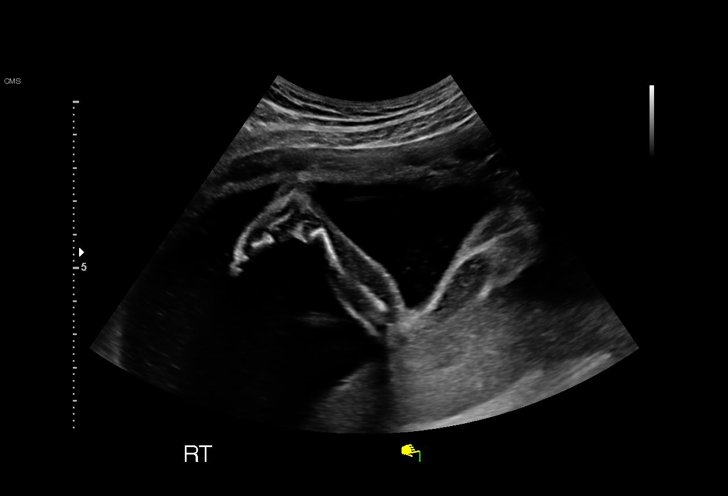
[im 91/103]
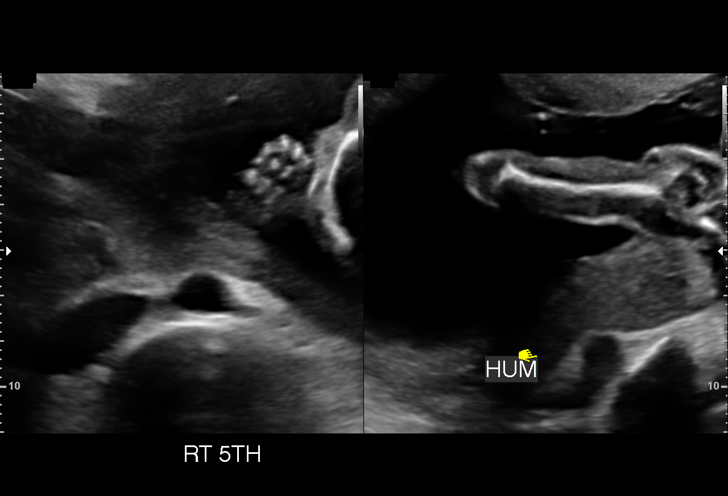
[im 99/103]
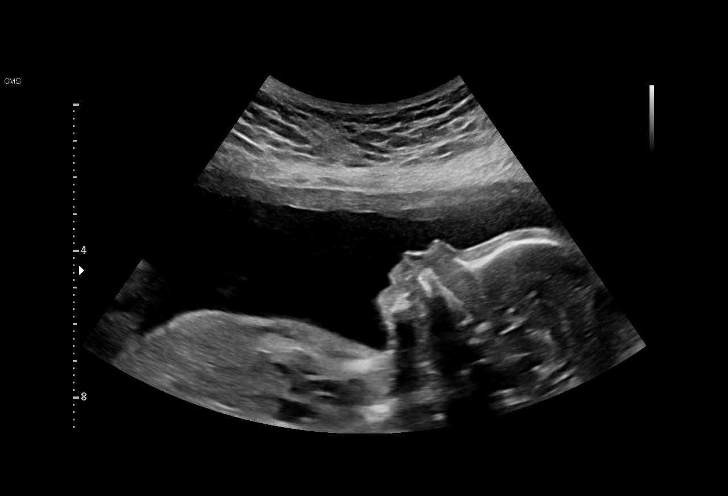

[13 of 28 positions shown; findings below may reference images not displayed]

[facility]                                        Obstetrics &
                                                              Gynecology
                                                              [address]
                                                              [place].

 1   US MFM OB COMP + 14 WK               [address]     KADENCE VAID

Indications

 21 weeks gestation of pregnancy
 Antenatal screening for malformations
Fetal Evaluation

 Num Of Fetuses:          1
 Fetal Heart Rate(bpm):   148
 Cardiac Activity:        Observed
 Presentation:            Variable
 Placenta:                Anterior
 P. Cord Insertion:       Visualized

 Amniotic Fluid
 AFI FV:      Within normal limits

                             Largest Pocket(cm)

Biometry

 BPD:      51.6   mm     G. Age:  21w 5d         45  %    CI:         72.02  %    70 - 86
                                                          FL/HC:       18.9  %    18.4 -
 HC:      193.5   mm     G. Age:  21w 4d         33  %    HC/AC:       1.16       1.06 -
 AC:      166.2   mm     G. Age:  21w 5d         40  %    FL/BPD:      70.9  %    71 - 87
 FL:       36.6   mm     G. Age:  21w 4d         37  %    FL/AC:       22.0  %    20 - 24
 HUM:      35.1   mm     G. Age:  22w 0d         58  %
 CER:      23.6   mm     G. Age:  21w 6d         53  %

 CM:         4.4  mm

 Est. FW:     439   gm    0 lb 15 oz      40  %
OB History

 Gravidity:     1         Term:  0          Prem:  0        SAB:   0
 TOP:           0       Ectopic: 0         Living: 0
Gestational Age

 LMP:            21w 5d       Date:  11/24/19                   EDD:  08/30/20
 U/S Today:      21w 5d                                         EDD:  08/30/20
 Best:           21w 5d    Det. By:  LMP  (11/24/19)            EDD:  08/30/20
Anatomy

 Cranium:                Appears normal         Aortic Arch:            Appears normal
 Cavum:                  Appears normal         Ductal Arch:            Appears normal
 Ventricles:             Appears normal         Diaphragm:              Appears normal
 Choroid Plexus:         Appears normal         Stomach:                Appears normal, left
                                                                        sided
 Cerebellum:             Appears normal         Abdomen:                Appears normal
 Posterior Fossa:        Appears normal         Abdominal Wall:         Appears nml (cord
                                                                        insert, abd wall)
 Nuchal Fold:            Not applicable (>20    Cord Vessels:           Appears normal (3
                         wks GA)                                        vessel cord)
 Face:                   Appears normal         Kidneys:                Appear normal
                         (orbits and profile)
 Lips:                   Appears normal         Bladder:                Appears normal
 Thoracic:               Appears normal         Spine:                  Appears normal
 Heart:                  Appears normal         Upper Extremities:      Appears normal
                         (4CH, axis, and situs)
 RVOT:                   Appears normal         Lower Extremities:      Appears normal
 LVOT:                   Appears normal

 Other:   Heels/feet and open hands/5th digits visualized. Nasal bone visualized.
Cervix Uterus Adnexa

 Cervix
 Length:            3.27  cm.
 Normal appearance by transabdominal scan.

 Uterus
 No abnormality visualized.

 Right Ovary
 Within normal limits.

 Left Ovary
 Within normal limits.

 Cul De Sac
 No free fluid seen.
 Adnexa
 No abnormality visualized.
Impression

 Normal anatomy with measurements consistent with dates
 Good fetel movement and amniotic fluid.
Recommendations

 Follow up growth as clinically indicated
